# Patient Record
Sex: Male | Born: 1960 | Race: White | Hispanic: No | Smoking: Current every day smoker
Health system: Southern US, Community
[De-identification: ages and names within clinical notes are randomized; demographics above are authoritative.]

## PROBLEM LIST (undated history)

## (undated) DIAGNOSIS — M1990 Unspecified osteoarthritis, unspecified site: Secondary | ICD-10-CM

## (undated) DIAGNOSIS — F32A Depression, unspecified: Secondary | ICD-10-CM

## (undated) DIAGNOSIS — J45909 Unspecified asthma, uncomplicated: Secondary | ICD-10-CM

## (undated) DIAGNOSIS — F329 Major depressive disorder, single episode, unspecified: Secondary | ICD-10-CM

## (undated) DIAGNOSIS — I10 Essential (primary) hypertension: Secondary | ICD-10-CM

## (undated) DIAGNOSIS — E119 Type 2 diabetes mellitus without complications: Secondary | ICD-10-CM

## (undated) HISTORY — PX: KNEE SURGERY: SHX244

## (undated) HISTORY — PX: HAND SURGERY: SHX662

## (undated) HISTORY — PX: SHOULDER SURGERY: SHX246

## (undated) HISTORY — PX: BACK SURGERY: SHX140

---

## 2004-08-26 ENCOUNTER — Ambulatory Visit: Payer: Self-pay | Admitting: Family Medicine

## 2005-01-18 ENCOUNTER — Ambulatory Visit: Payer: Self-pay | Admitting: Pain Medicine

## 2005-04-12 ENCOUNTER — Ambulatory Visit: Payer: Self-pay | Admitting: Unknown Physician Specialty

## 2005-07-15 ENCOUNTER — Ambulatory Visit: Payer: Self-pay | Admitting: Unknown Physician Specialty

## 2005-10-29 ENCOUNTER — Other Ambulatory Visit: Payer: Self-pay

## 2005-11-05 ENCOUNTER — Ambulatory Visit: Payer: Self-pay | Admitting: Unknown Physician Specialty

## 2005-12-06 ENCOUNTER — Other Ambulatory Visit: Payer: Self-pay

## 2005-12-13 ENCOUNTER — Inpatient Hospital Stay: Payer: Self-pay | Admitting: Unknown Physician Specialty

## 2005-12-17 ENCOUNTER — Inpatient Hospital Stay: Payer: Self-pay | Admitting: Unknown Physician Specialty

## 2006-07-20 ENCOUNTER — Emergency Department: Payer: Self-pay | Admitting: Emergency Medicine

## 2006-11-24 ENCOUNTER — Ambulatory Visit: Payer: Self-pay | Admitting: Unknown Physician Specialty

## 2007-05-23 ENCOUNTER — Ambulatory Visit: Payer: Self-pay | Admitting: Internal Medicine

## 2008-10-20 ENCOUNTER — Ambulatory Visit: Payer: Self-pay | Admitting: Family Medicine

## 2009-03-22 ENCOUNTER — Ambulatory Visit: Payer: Self-pay | Admitting: Family Medicine

## 2010-05-02 ENCOUNTER — Ambulatory Visit: Payer: Self-pay | Admitting: Internal Medicine

## 2010-08-14 ENCOUNTER — Ambulatory Visit: Payer: Self-pay | Admitting: Unknown Physician Specialty

## 2010-08-26 ENCOUNTER — Encounter: Payer: Self-pay | Admitting: Unknown Physician Specialty

## 2010-10-26 ENCOUNTER — Ambulatory Visit: Payer: Self-pay | Admitting: Family Medicine

## 2010-11-13 ENCOUNTER — Ambulatory Visit: Payer: Self-pay | Admitting: Family Medicine

## 2011-05-03 ENCOUNTER — Ambulatory Visit: Payer: Self-pay | Admitting: Unknown Physician Specialty

## 2011-05-11 ENCOUNTER — Inpatient Hospital Stay: Payer: Self-pay | Admitting: Unknown Physician Specialty

## 2011-05-31 ENCOUNTER — Encounter: Payer: Self-pay | Admitting: Unknown Physician Specialty

## 2011-06-15 ENCOUNTER — Encounter: Payer: Self-pay | Admitting: Unknown Physician Specialty

## 2011-09-27 ENCOUNTER — Emergency Department: Payer: Self-pay | Admitting: Emergency Medicine

## 2011-09-27 LAB — COMPREHENSIVE METABOLIC PANEL
Albumin: 4.2 g/dL (ref 3.4–5.0)
Alkaline Phosphatase: 87 U/L (ref 50–136)
BUN: 12 mg/dL (ref 7–18)
Bilirubin,Total: 0.5 mg/dL (ref 0.2–1.0)
Calcium, Total: 10.4 mg/dL — ABNORMAL HIGH (ref 8.5–10.1)
Chloride: 104 mmol/L (ref 98–107)
Co2: 27 mmol/L (ref 21–32)
EGFR (African American): >60
Glucose: 105 mg/dL — ABNORMAL HIGH (ref 65–99)
SGOT(AST): 31 U/L (ref 15–37)
Sodium: 140 mmol/L (ref 136–145)
Total Protein: 8.4 g/dL — ABNORMAL HIGH (ref 6.4–8.2)

## 2011-09-27 LAB — URINALYSIS, COMPLETE
Bilirubin,UR: NEGATIVE
Blood: NEGATIVE
Glucose,UR: NEGATIVE mg/dL (ref 0–75)
Ketone: NEGATIVE
Leukocyte Esterase: NEGATIVE
Nitrite: NEGATIVE
Specific Gravity: 1.015 (ref 1.003–1.030)

## 2011-09-27 LAB — CBC
HCT: 45.2 % (ref 40.0–52.0)
HGB: 15.1 g/dL (ref 13.0–18.0)
MCHC: 33.4 g/dL (ref 32.0–36.0)
MCV: 89 fL (ref 80–100)
Platelet: 299 10*3/uL (ref 150–440)
RDW: 13.2 % (ref 11.5–14.5)
WBC: 13.5 10*3/uL — ABNORMAL HIGH (ref 3.8–10.6)

## 2011-09-27 LAB — LIPASE, BLOOD: Lipase: 118 U/L (ref 73–393)

## 2012-03-15 ENCOUNTER — Ambulatory Visit: Payer: Self-pay | Admitting: Orthopedic Surgery

## 2012-03-15 LAB — BASIC METABOLIC PANEL
Anion Gap: 9 (ref 7–16)
Calcium, Total: 9 mg/dL (ref 8.5–10.1)
Chloride: 106 mmol/L (ref 98–107)
Co2: 24 mmol/L (ref 21–32)
EGFR (African American): >60
Osmolality: 281 (ref 275–301)
Potassium: 3.7 mmol/L (ref 3.5–5.1)

## 2012-03-15 LAB — CBC
HCT: 43.9 % (ref 40.0–52.0)
HGB: 14.9 g/dL (ref 13.0–18.0)
MCH: 29.8 pg (ref 26.0–34.0)
MCV: 88 fL (ref 80–100)
Platelet: 247 10*3/uL (ref 150–440)
WBC: 11.2 10*3/uL — ABNORMAL HIGH (ref 3.8–10.6)

## 2012-03-15 LAB — MRSA PCR SCREENING

## 2012-03-15 LAB — SEDIMENTATION RATE: Erythrocyte Sed Rate: 4 mm/hr (ref 0–15)

## 2012-03-23 ENCOUNTER — Inpatient Hospital Stay: Payer: Self-pay | Admitting: Orthopedic Surgery

## 2012-03-24 LAB — BASIC METABOLIC PANEL
Anion Gap: 10 (ref 7–16)
Calcium, Total: 7.8 mg/dL — ABNORMAL LOW (ref 8.5–10.1)
Chloride: 106 mmol/L (ref 98–107)
Co2: 23 mmol/L (ref 21–32)
Creatinine: 0.84 mg/dL (ref 0.60–1.30)
EGFR (African American): >60
EGFR (Non-African Amer.): >60
Potassium: 3.4 mmol/L — ABNORMAL LOW (ref 3.5–5.1)
Sodium: 139 mmol/L (ref 136–145)

## 2012-03-24 LAB — PLATELET COUNT: Platelet: 196 10*3/uL (ref 150–440)

## 2012-03-24 LAB — HEMOGLOBIN: HGB: 13.3 g/dL (ref 13.0–18.0)

## 2012-04-18 ENCOUNTER — Encounter: Payer: Self-pay | Admitting: Orthopedic Surgery

## 2012-09-01 ENCOUNTER — Emergency Department: Payer: Self-pay | Admitting: Emergency Medicine

## 2012-09-01 LAB — CBC
HGB: 14.4 g/dL (ref 13.0–18.0)
MCHC: 34.2 g/dL (ref 32.0–36.0)
MCV: 90 fL (ref 80–100)
RDW: 13.4 % (ref 11.5–14.5)
WBC: 17.6 10*3/uL — ABNORMAL HIGH (ref 3.8–10.6)

## 2012-09-01 LAB — BASIC METABOLIC PANEL
Chloride: 99 mmol/L (ref 98–107)
EGFR (African American): >60
Glucose: 392 mg/dL — ABNORMAL HIGH (ref 65–99)
Osmolality: 284 (ref 275–301)
Potassium: 3.7 mmol/L (ref 3.5–5.1)

## 2012-09-12 ENCOUNTER — Ambulatory Visit: Payer: Self-pay | Admitting: Orthopedic Surgery

## 2012-09-21 ENCOUNTER — Ambulatory Visit: Payer: Self-pay | Admitting: Otolaryngology

## 2012-10-18 ENCOUNTER — Ambulatory Visit: Payer: Self-pay | Admitting: Family Medicine

## 2012-10-24 ENCOUNTER — Ambulatory Visit: Payer: Self-pay | Admitting: Otolaryngology

## 2012-11-29 ENCOUNTER — Ambulatory Visit: Payer: Self-pay | Admitting: Pain Medicine

## 2013-01-15 ENCOUNTER — Ambulatory Visit: Payer: Self-pay | Admitting: Pain Medicine

## 2013-03-21 ENCOUNTER — Ambulatory Visit: Payer: Self-pay | Admitting: Pain Medicine

## 2013-09-12 ENCOUNTER — Ambulatory Visit: Payer: Self-pay | Admitting: Family Medicine

## 2013-11-19 ENCOUNTER — Ambulatory Visit: Payer: Self-pay | Admitting: Physician Assistant

## 2013-12-16 ENCOUNTER — Ambulatory Visit: Payer: Self-pay | Admitting: Internal Medicine

## 2014-01-10 ENCOUNTER — Ambulatory Visit: Payer: Self-pay | Admitting: Unknown Physician Specialty

## 2014-03-20 DIAGNOSIS — E1159 Type 2 diabetes mellitus with other circulatory complications: Secondary | ICD-10-CM | POA: Insufficient documentation

## 2014-03-20 DIAGNOSIS — K219 Gastro-esophageal reflux disease without esophagitis: Secondary | ICD-10-CM | POA: Insufficient documentation

## 2014-03-20 DIAGNOSIS — E1165 Type 2 diabetes mellitus with hyperglycemia: Secondary | ICD-10-CM | POA: Insufficient documentation

## 2014-03-29 DIAGNOSIS — E785 Hyperlipidemia, unspecified: Secondary | ICD-10-CM | POA: Insufficient documentation

## 2014-03-29 DIAGNOSIS — J449 Chronic obstructive pulmonary disease, unspecified: Secondary | ICD-10-CM | POA: Insufficient documentation

## 2014-03-29 DIAGNOSIS — G8929 Other chronic pain: Secondary | ICD-10-CM | POA: Insufficient documentation

## 2014-08-27 DIAGNOSIS — G4733 Obstructive sleep apnea (adult) (pediatric): Secondary | ICD-10-CM | POA: Insufficient documentation

## 2014-08-27 DIAGNOSIS — G8929 Other chronic pain: Secondary | ICD-10-CM | POA: Insufficient documentation

## 2014-08-27 DIAGNOSIS — J302 Other seasonal allergic rhinitis: Secondary | ICD-10-CM | POA: Insufficient documentation

## 2014-10-01 NOTE — Discharge Summary (Signed)
PATIENT NAME:  Karl Weaver, Karl Weaver MR#:  [id number] DATE OF BIRTH:  October 05, 1960  DATE OF ADMISSION:  03/23/2012 DATE OF DISCHARGE:  03/25/2012  ADMITTING DIAGNOSIS: Unstable right total knee.   DISCHARGE DIAGNOSIS: Unstable right total knee.   PROCEDURE: Revision of total knee arthroplasty, right knee.   SURGEON: Leitha Schuller, M.D.   ASSISTANT: Devota Pace, NP, [place] Neighbor, PA-C.   HISTORY: Patient is a 54 year old male underwent right total knee replacement by Dr. Gerrit Heck in 2012. Over the last few months patient had noted some giving out sensation and instability within the right knee. The patient has an increased popping sensation. Patient saw Thompson Grayer, PA as well as Dr. Rosita Kea and was diagnosed with some instability of the knee and it was recommended to have revision for exchange of the polyethylene insert. Risks and benefits of the procedure have been discussed with the patient, and he as agreed and consented to the surgery. Surgery is scheduled for 03/23/2012.   PHYSICAL EXAMINATION: Head is normocephalic, atraumatic. Pupils equal, round, and reactive to light. LUNGS: Clear to auscultation. HEART: Regular rate and rhythm. RIGHT LOWER EXTREMITY: Examination of the right knee shows the patient has an old incision that is completely healed. There is no swelling, warmth or erythema. Patient has no effusion. He has good range of motion of 0 to 115 degrees. Patient does have significant mid flexion instability 90 degrees. He also has evidence of varus and valgus stress. The patient does have quite a bit of discomfort with the stress test. He is neurovascularly intact to the right lower extremity.   HOSPITAL COURSE: Patient was admitted to the hospital on 03/23/2012. He had surgery that day and was brought to the orthopedic floor from the PAC-U in stable condition. Patient's lab work and vital signs remained stable throughout his stay and he had progressed very well with physical therapy. On  03/25/2012 patient was stable and ready for discharge home with home health.   CONDITION AT DISCHARGE: Stable.   DISCHARGE INSTRUCTIONS:  1. Patient may gradually increase weight-bearing on the affected extremity. He may elevate the affected foot and leg on 1 or 2 pillows with the foot higher than the knee.  2. Knee-high TED hose on both legs and remove at bedtime. Replace when arising the next morning. 3. Diet: Patient may resume a regular diet as tolerated.  4. He should take aspirin 325 mg once a day. Resume typical home medications. Tylenol 650 to 1000 mg every six hours as needed for pain, oxycodone 5 to 10 mg every four hours as needed for pain.  5. Wound care: Apply an ice pack to affected area. Continue using Polar Care unit maintaining temperature between 40 and 50 degrees. Do not get the dressing or bandage wet or dirty. Call [facility] orthopedics if the dressing gets water under it. Leave the dressing on.  6. Symptoms to report: Call [facility] orthopedics if any of the following occur: Bright red bleeding from the incision wound, fever above 101.5 degrees, redness, swelling, or drainage at the incision. Call [facility] orthopedics office if you experience any increased leg pain, numbness, weakness in legs or bowel or bladder symptoms.   REFERRALS: Patient referred to home physical therapy. He should call [facility] orthopedics if he has not heard from them in 48 hours. He has a follow-up appointment at [facility] orthopedics in two weeks.   DISCHARGE MEDICATIONS:  1. ProAir HFA 90 mcg inhaler inhalation aerosol with adapter 2 puffs  inhaled once a day as needed. 2. Doxycycline hyclate 100 mg oral tablet 1 tablet orally 2 times a day.  ____________________________ Evon Slackhomas C. Ayub Kirsh, PA-C tcg:cms D: 03/27/2012 17:19:44 ET T: 03/28/2012 10:58:15 ET JOB#: [id number]  cc: Evon Slackhomas C. Lavra Imler, PA-C, <Dictator> Evon SlackHOMAS C Carlus Stay GeorgiaPA ELECTRONICALLY SIGNED 03/28/2012  12:38

## 2014-10-01 NOTE — Op Note (Signed)
PATIENT NAME:  Assunta FoundBURNS, Khaliq P MR#:  [id number] DATE OF BIRTH:  [birth year]  DATE OF PROCEDURE:  03/23/2012  PREOPERATIVE DIAGNOSIS: Unstable right total knee.   POSTOPERATIVE DIAGNOSIS: Unstable right total knee.  PROCEDURE: Revision of total knee arthroplasty, right knee.  SURGEON: Kennedy BuckerMichael Malyia Moro, MD  ASSISTANT: April Berndt, NP / Cranston Neighborhris Gaines, PA-C  DESCRIPTION OF PROCEDURE: The patient was brought to the Operating Room and, after adequate anesthesia was obtained, the right leg was prepped and draped in the usual sterile fashion with a tourniquet and Alvarado legholder utilized. The tourniquet was not used during the procedure. After patient identification and time out procedures were completed, the prior incision was opened with a midline skin incision followed by a medial parapatellar arthrotomy. On inspection of the knee, in flexion and extension, there was significant medial and lateral instability to direct visualization. Scar tissue was removed. 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 This was all removed followed by the prior polyethylene component. The prior component was a 13 mm and a 16 mm trial appeared to give a good fit so the 13 mm final component was inserted. In extension and flexion there was good stability with just slight opening in mid flexion of about a millimeter the valgus stress. The tibial component did appear to be somewhat internally rotated. After placement of the final component and determining that adequate stability had been obtained, the knee was thoroughly irrigated and hemostasis checked with electrocautery. The arthrotomy was repaired using a heavy quill suture followed by 2-0 quill subcutaneously followed by skin staples. Xeroform, 4 x 4's, Webril, and Ace wrap were applied. The patient was sent to the recovery room in stable  condition.   ESTIMATED BLOOD LOSS: 50.   COMPLICATIONS: None.    SPECIMEN: None.   IMPLANTS: Triathlon size 5, 16-mm PS tibial insert. ____________________________ Leitha SchullerMichael J. Jeanna Giuffre, MD mjm:slb D: 03/23/2012 23:14:02 ET T: 03/24/2012 10:22:23 ET JOB#: [id number]  cc: Leitha SchullerMichael J. Khaliyah Northrop, MD, <Dictator> Leitha SchullerMICHAEL J Darrel Gloss MD ELECTRONICALLY SIGNED 03/24/2012 15:17

## 2015-02-21 ENCOUNTER — Encounter: Payer: Self-pay | Admitting: *Deleted

## 2015-02-21 ENCOUNTER — Emergency Department: Payer: Medicaid Other

## 2015-02-21 DIAGNOSIS — E119 Type 2 diabetes mellitus without complications: Secondary | ICD-10-CM | POA: Insufficient documentation

## 2015-02-21 DIAGNOSIS — J45901 Unspecified asthma with (acute) exacerbation: Secondary | ICD-10-CM | POA: Diagnosis not present

## 2015-02-21 DIAGNOSIS — M25561 Pain in right knee: Secondary | ICD-10-CM | POA: Diagnosis present

## 2015-02-21 DIAGNOSIS — I10 Essential (primary) hypertension: Secondary | ICD-10-CM | POA: Insufficient documentation

## 2015-02-21 DIAGNOSIS — Z720 Tobacco use: Secondary | ICD-10-CM | POA: Diagnosis not present

## 2015-02-21 MED ORDER — ALBUTEROL SULFATE (2.5 MG/3ML) 0.083% IN NEBU
5.0000 mg | INHALATION_SOLUTION | Freq: Once | RESPIRATORY_TRACT | Status: AC
  Administered 2015-02-21: 5 mg via RESPIRATORY_TRACT
  Filled 2015-02-21: qty 6

## 2015-02-21 NOTE — ED Notes (Signed)
While in the lobby waiting, pt states he started coughing and then could not catch his breath. Brought back into the triage room for assessment, pt with increased respirations and mild wheezing. Will administer breathing treatment as protocol.

## 2015-02-21 NOTE — ED Notes (Signed)
Per ems, the pt felt his knee pop yesterday morning when he woke up, wants to be seen tonight for the pain. Pt with hx of knee surgery to this same extremity. Pt was able to ambulate to EMS stretcher per report.

## 2015-02-22 ENCOUNTER — Emergency Department
Admission: EM | Admit: 2015-02-22 | Discharge: 2015-02-22 | Disposition: A | Discharge status: Home / Self Care | Payer: Medicaid Other | Attending: Emergency Medicine | Admitting: Emergency Medicine

## 2015-02-22 DIAGNOSIS — M25561 Pain in right knee: Secondary | ICD-10-CM

## 2015-02-22 HISTORY — DX: Unspecified asthma, uncomplicated: J45.909

## 2015-02-22 HISTORY — DX: Essential (primary) hypertension: I10

## 2015-02-22 HISTORY — DX: Type 2 diabetes mellitus without complications: E11.9

## 2015-02-22 HISTORY — DX: Unspecified osteoarthritis, unspecified site: M19.90

## 2015-02-22 MED ORDER — OXYCODONE-ACETAMINOPHEN 5-325 MG PO TABS
2.0000 | ORAL_TABLET | Freq: Once | ORAL | Status: AC
  Administered 2015-02-22: 2 via ORAL
  Filled 2015-02-22: qty 2

## 2015-02-22 MED ORDER — OXYCODONE-ACETAMINOPHEN 5-325 MG PO TABS
1.0000 | ORAL_TABLET | ORAL | Status: DC | PRN
Start: 2015-02-22 — End: 2018-06-17

## 2015-02-22 NOTE — Discharge Instructions (Signed)
Your knee x-ray looked okay-no acute changes. Wear the knee immobilizer for comfort and support. Take Percocet if needed for acute pain. Return to the emergency department if you have increasing pain, redness or warmth, or if you have other urgent concerns.  Knee Pain The knee is the complex joint between your thigh and your lower leg. It is made up of bones, tendons, ligaments, and cartilage. The bones that make up the knee are:  The femur in the thigh.  The tibia and fibula in the lower leg.  The patella or kneecap riding in the groove on the lower femur. CAUSES  Knee pain is a common complaint with many causes. A few of these causes are:  Injury, such as:  A ruptured ligament or tendon injury.  Torn cartilage.  Medical conditions, such as:  Gout  Arthritis  Infections  Overuse, over training, or overdoing a physical activity. Knee pain can be minor or severe. Knee pain can accompany debilitating injury. Minor knee problems often respond well to self-care measures or get well on their own. More serious injuries may need medical intervention or even surgery. SYMPTOMS The knee is complex. Symptoms of knee problems can vary widely. Some of the problems are:  Pain with movement and weight bearing.  Swelling and tenderness.  Buckling of the knee.  Inability to straighten or extend your knee.  Your knee locks and you cannot straighten it.  Warmth and redness with pain and fever.  Deformity or dislocation of the kneecap. DIAGNOSIS  Determining what is wrong may be very straight forward such as when there is an injury. It can also be challenging because of the complexity of the knee. Tests to make a diagnosis may include:  Your caregiver taking a history and doing a physical exam.  Routine X-rays can be used to rule out other problems. X-rays will not reveal a cartilage tear. Some injuries of the knee can be diagnosed by:  Arthroscopy a surgical technique by which a  small video camera is inserted through tiny incisions on the sides of the knee. This procedure is used to examine and repair internal knee joint problems. Tiny instruments can be used during arthroscopy to repair the torn knee cartilage (meniscus).  Arthrography is a radiology technique. A contrast liquid is directly injected into the knee joint. Internal structures of the knee joint then become visible on X-ray film.  An MRI scan is a non X-ray radiology procedure in which magnetic fields and a computer produce two- or three-dimensional images of the inside of the knee. Cartilage tears are often visible using an MRI scanner. MRI scans have largely replaced arthrography in diagnosing cartilage tears of the knee.  Blood work.  Examination of the fluid that helps to lubricate the knee joint (synovial fluid). This is done by taking a sample out using a needle and a syringe. TREATMENT The treatment of knee problems depends on the cause. Some of these treatments are:  Depending on the injury, proper casting, splinting, surgery, or physical therapy care will be needed.  Give yourself adequate recovery time. Do not overuse your joints. If you begin to get sore during workout routines, back off. Slow down or do fewer repetitions.  For repetitive activities such as cycling or running, maintain your strength and nutrition.  Alternate muscle groups. For example, if you are a weight lifter, work the upper body on one day and the lower body the next.  Either tight or weak muscles do not give the proper  support for your knee. Tight or weak muscles do not absorb the stress placed on the knee joint. Keep the muscles surrounding the knee strong.  Take care of mechanical problems.  If you have flat feet, orthotics or special shoes may help. See your caregiver if you need help.  Arch supports, sometimes with wedges on the inner or outer aspect of the heel, can help. These can shift pressure away from the side  of the knee most bothered by osteoarthritis.  A brace called an "unloader" brace also may be used to help ease the pressure on the most arthritic side of the knee.  If your caregiver has prescribed crutches, braces, wraps or ice, use as directed. The acronym for this is PRICE. This means protection, rest, ice, compression, and elevation.  Nonsteroidal anti-inflammatory drugs (NSAIDs), can help relieve pain. But if taken immediately after an injury, they may actually increase swelling. Take NSAIDs with food in your stomach. Stop them if you develop stomach problems. Do not take these if you have a history of ulcers, stomach pain, or bleeding from the bowel. Do not take without your caregiver's approval if you have problems with fluid retention, heart failure, or kidney problems.  For ongoing knee problems, physical therapy may be helpful.  Glucosamine and chondroitin are over-the-counter dietary supplements. Both may help relieve the pain of osteoarthritis in the knee. These medicines are different from the usual anti-inflammatory drugs. Glucosamine may decrease the rate of cartilage destruction.  Injections of a corticosteroid drug into your knee joint may help reduce the symptoms of an arthritis flare-up. They may provide pain relief that lasts a few months. You may have to wait a few months between injections. The injections do have a small increased risk of infection, water retention, and elevated blood sugar levels.  Hyaluronic acid injected into damaged joints may ease pain and provide lubrication. These injections may work by reducing inflammation. A series of shots may give relief for as long as 6 months.  Topical painkillers. Applying certain ointments to your skin may help relieve the pain and stiffness of osteoarthritis. Ask your pharmacist for suggestions. Many over the-counter products are approved for temporary relief of arthritis pain.  In some countries, doctors often prescribe  topical NSAIDs for relief of chronic conditions such as arthritis and tendinitis. A review of treatment with NSAID creams found that they worked as well as oral medications but without the serious side effects. PREVENTION  Maintain a healthy weight. Extra pounds put more strain on your joints.  Get strong, stay limber. Weak muscles are a common cause of knee injuries. Stretching is important. Include flexibility exercises in your workouts.  Be smart about exercise. If you have osteoarthritis, chronic knee pain or recurring injuries, you may need to change the way you exercise. This does not mean you have to stop being active. If your knees ache after jogging or playing basketball, consider switching to swimming, water aerobics, or other low-impact activities, at least for a few days a week. Sometimes limiting high-impact activities will provide relief.  Make sure your shoes fit well. Choose footwear that is right for your sport.  Protect your knees. Use the proper gear for knee-sensitive activities. Use kneepads when playing volleyball or laying carpet. Buckle your seat belt every time you drive. Most shattered kneecaps occur in car accidents.  Rest when you are tired. SEEK MEDICAL CARE IF:  You have knee pain that is continual and does not seem to be getting better.  SEEK IMMEDIATE MEDICAL CARE IF:  Your knee joint feels hot to the touch and you have a high fever. MAKE SURE YOU:   Understand these instructions.  Will watch your condition.  Will get help right away if you are not doing well or get worse. Document Released: 03/28/2007 Document Revised: 08/23/2011 Document Reviewed: 03/28/2007 [facility] Patient Information 2015 [place], [place]. This information is not intended to replace advice given to you by your health care provider. Make sure you discuss any questions you have with your health care provider.

## 2015-02-22 NOTE — ED Provider Notes (Signed)
[facility] Emergency Department Provider Note  ____________________________________________  Time seen: 0145  I have reviewed the triage vital signs and the nursing notes.   HISTORY  Chief Complaint Knee Pain     HPI Karl Weaver. is a 54 y.o. male who had a knee replacement in the right knee in 2013. On Thursday of this past week, 2 days ago, he reports he bent down and felt a pop initially and has been having pain in the knee since.  With the significant pain, he has had decreased mobility. His been lying in a recliner. He now feels like he has discomfort that travels up and down the right leg. The pain is greatest on the lateral side of the right knee.  He denies any swelling of the calf. He denies any fever.  Upon arrival at the hospital, he began having increased shortness of breath. He reports that he has asthma and since he had this episode with his right knee 2 days ago he has had more difficulty breathing due to the pain.  He was treated with nebulizer treatment upfront by the nurses. He is breathing better now. He is having no chest pain.   Past Medical History  Diagnosis Date  . Diabetes mellitus without complication   . Asthma   . Arthritis   . Hypertension     There are no active problems to display for this patient.   Past Surgical History  Procedure Laterality Date  . Knee surgery      Current Outpatient Rx  Name  Route  Sig  Dispense  Refill  . oxyCODONE-acetaminophen (PERCOCET/ROXICET) 5-325 MG per tablet   Oral   Take 1 tablet by mouth every 4 (four) hours as needed for severe pain.   16 tablet   0     Allergies Codeine  No family history on file.  Social History Social History  Substance Use Topics  . Smoking status: Current Every Day Smoker  . Smokeless tobacco: None  . Alcohol Use: No    Review of Systems  Constitutional: Negative for fever. ENT: Negative for sore throat. Cardiovascular:  Negative for chest pain. Respiratory: Increased shortness of breath over the past 2 days. History of asthma. Gastrointestinal: Negative for abdominal pain, vomiting and diarrhea. Genitourinary: Negative for dysuria. Musculoskeletal: Pain, right knee Skin: Negative for rash. Neurological: Negative for headaches   10-point ROS otherwise negative.  ____________________________________________   PHYSICAL EXAM:  VITAL SIGNS: ED Triage Vitals  Enc Vitals Group     BP 02/21/15 2232 156/94 mmHg     Pulse Rate 02/21/15 2232 112     Resp 02/21/15 2232 18     Temp 02/21/15 2232 98.6 F (37 C)     Temp Source 02/21/15 2232 Oral     SpO2 02/21/15 2232 94 %     Weight 02/21/15 2232 255 lb (115.667 kg)     Height 02/21/15 2232 6' (1.829 m)     Head Cir --      Peak Flow --      Pain Score 02/21/15 2231 9     Pain Loc --      Pain Edu? --      Excl. in GC? --     Constitutional:  Alert and oriented. Well appearing and in no distress. ENT   Head: Normocephalic and atraumatic.   Nose: No congestion/rhinnorhea.   Mouth/Throat: Mucous membranes are moist. Cardiovascular: Normal rate, regular rhythm, no murmur noted Respiratory:  Normal respiratory effort, no tachypnea.    Breath sounds are clear and equal bilaterally.  Gastrointestinal: Soft and nontender. No distention.  Back: No muscle spasm, no tenderness, no CVA tenderness. Musculoskeletal: Midline incision over the right knee status post total knee replacement. This is well-healed with no erythema. The patient has notable pain on palpation as well as with any attempt at movement.  No noted edema. Neurologic:  Normal speech and language. No gross focal neurologic deficits are appreciated.  Skin:  Skin is warm, dry. No rash noted. Psychiatric: Mood and affect are normal. Speech and behavior are normal.  ____________________________________________    RADIOLOGY  Right knee FINDINGS: Previous right knee arthroplasty.  The hardware components are in anatomic alignment. No complications. No periprosthetic fracture or dislocation.  IMPRESSION: 1. No acute findings. ____________________________________________   INITIAL IMPRESSION / ASSESSMENT AND PLAN / ED COURSE  Pertinent labs & imaging results that were available during my care of the patient were reviewed by me and considered in my medical decision making (see chart for details).  X-ray of knee does not show any acute changes. We will place the patient in a knee immobilizer. We will treat his pain was 2 Percocets now and a prescription for ongoing Percocet to this weekend. We've asked him to follow-up with his orthopedic doctor, Dr. Kennedy Bucker.  The patient reports he has an appointment on Tuesday with his primary physician as well.  ____________________________________________   FINAL CLINICAL IMPRESSION(S) / ED DIAGNOSES  Final diagnoses:  Acute knee pain, right      Darien Ramus, MD 02/22/15 [phone]

## 2015-07-02 ENCOUNTER — Emergency Department
Admission: EM | Admit: 2015-07-02 | Discharge: 2015-07-02 | Disposition: A | Discharge status: Home / Self Care | Payer: Medicaid Other | Attending: Emergency Medicine | Admitting: Emergency Medicine

## 2015-07-02 DIAGNOSIS — Z7951 Long term (current) use of inhaled steroids: Secondary | ICD-10-CM | POA: Diagnosis not present

## 2015-07-02 DIAGNOSIS — Z7984 Long term (current) use of oral hypoglycemic drugs: Secondary | ICD-10-CM | POA: Insufficient documentation

## 2015-07-02 DIAGNOSIS — E1165 Type 2 diabetes mellitus with hyperglycemia: Secondary | ICD-10-CM | POA: Diagnosis not present

## 2015-07-02 DIAGNOSIS — F17200 Nicotine dependence, unspecified, uncomplicated: Secondary | ICD-10-CM | POA: Diagnosis not present

## 2015-07-02 DIAGNOSIS — Z79899 Other long term (current) drug therapy: Secondary | ICD-10-CM | POA: Diagnosis not present

## 2015-07-02 DIAGNOSIS — I10 Essential (primary) hypertension: Secondary | ICD-10-CM | POA: Insufficient documentation

## 2015-07-02 DIAGNOSIS — R079 Chest pain, unspecified: Secondary | ICD-10-CM | POA: Insufficient documentation

## 2015-07-02 DIAGNOSIS — R739 Hyperglycemia, unspecified: Secondary | ICD-10-CM

## 2015-07-02 LAB — COMPREHENSIVE METABOLIC PANEL
ALBUMIN: 4.3 g/dL (ref 3.5–5.0)
ALT: 79 U/L — ABNORMAL HIGH (ref 17–63)
ANION GAP: 11 (ref 5–15)
AST: 56 U/L — ABNORMAL HIGH (ref 15–41)
Alkaline Phosphatase: 111 U/L (ref 38–126)
BUN: 15 mg/dL (ref 6–20)
CHLORIDE: 104 mmol/L (ref 101–111)
CO2: 21 mmol/L — AB (ref 22–32)
Calcium: 9.5 mg/dL (ref 8.9–10.3)
Creatinine, Ser: 0.98 mg/dL (ref 0.61–1.24)
GFR calc Af Amer: >60 mL/min (ref >60)
GFR calc non Af Amer: >60 mL/min (ref >60)
GLUCOSE: 266 mg/dL — AB (ref 65–99)
POTASSIUM: 3.8 mmol/L (ref 3.5–5.1)
SODIUM: 136 mmol/L (ref 135–145)
Total Bilirubin: 0.6 mg/dL (ref 0.3–1.2)
Total Protein: 8.1 g/dL (ref 6.5–8.1)

## 2015-07-02 LAB — CBC
HCT: 44.2 % (ref 40.0–52.0)
Hemoglobin: 15.1 g/dL (ref 13.0–18.0)
MCH: 29.8 pg (ref 26.0–34.0)
MCHC: 34.2 g/dL (ref 32.0–36.0)
MCV: 87.3 fL (ref 80.0–100.0)
Platelets: 237 10*3/uL (ref 150–440)
RBC: 5.07 MIL/uL (ref 4.40–5.90)
RDW: 13 % (ref 11.5–14.5)
WBC: 11.3 10*3/uL — AB (ref 3.8–10.6)

## 2015-07-02 LAB — URINALYSIS COMPLETE WITH MICROSCOPIC (ARMC ONLY)
BACTERIA UA: NONE SEEN
BILIRUBIN URINE: NEGATIVE
Glucose, UA: >500 mg/dL — AB
HGB URINE DIPSTICK: NEGATIVE
LEUKOCYTES UA: NEGATIVE
Nitrite: NEGATIVE
PH: 5 (ref 5.0–8.0)
Protein, ur: 30 mg/dL — AB
SQUAMOUS EPITHELIAL / LPF: NONE SEEN
Specific Gravity, Urine: 1.029 (ref 1.005–1.030)

## 2015-07-02 LAB — GLUCOSE, CAPILLARY: Glucose-Capillary: 245 mg/dL — ABNORMAL HIGH (ref 65–99)

## 2015-07-02 LAB — TROPONIN I

## 2015-07-02 NOTE — ED Notes (Signed)
Pt presents to ED with c/o high blood sugar, pt states blood sugar reading have been varying x 2 weeks, ranging in there 170's. Pt states "I felt a little funny so I got up to check my sugar." Pt reports checked blood sugar at 2 am (CBG 479). Pt reports increase in thirst. Denies vision problems, denies chest pain, shortness of breath, or other complaints at this time. Pt reports he takes Metformin 1000g BID and Glimepiride 1 mg/day, reports took medication yesterday. Pt reports has appointment to see PCP at [facility] on 07/08/15. Pt also reports had left sided chest pain on Monday, states pain was sharp and stabbing. Denies chest pain at this time. Pt alert and oriented x 4, skin warm and dry, no increased work in breathing noted.

## 2015-07-02 NOTE — ED Provider Notes (Signed)
[facility] Emergency Department Provider Note  ____________________________________________  Time seen: Approximately 350 AM  I have reviewed the triage vital signs and the nursing notes.   HISTORY  Chief Complaint Hyperglycemia    HPI Karl Volker. is a 55 y.o. male who comes into the hospital today with elevated blood sugars.The patient reports that his blood sugars have been out of whack for the past 2 weeks. He reports that they have been ranging from 178-378 when they're normally in the high 90s. He reports that tonight when he checked his blood sugar was 479 and has not been that high recently. The patient called EMS to come in for evaluation. The patient reports that he has been drinking water and coffee and he has been eating mainly meat and vegetables as well as some salad. The patient reports that he has been taking his medicine like he supposed to. He took his metformin today with the second dose being around 6 PM and that he had some teriyaki chicken wings at about 9:30. The patient reports that he has called his doctor and has an appointment next week but his blood sugars are still very high. The patient also has some problems with his arthritis and he is unsure exactly what to do so he came into the hospital tonight. The patient reports that he did not take any other medicines after calling EMS and checking his blood sugar.   Past Medical History  Diagnosis Date  . Diabetes mellitus without complication   . Asthma   . Arthritis   . Hypertension     There are no active problems to display for this patient.   Past Surgical History  Procedure Laterality Date  . Knee surgery      Current Outpatient Rx  Name  Route  Sig  Dispense  Refill  . beclomethasone (QVAR) 40 MCG/ACT inhaler   Inhalation   Inhale 2 puffs into the lungs 2 (two) times daily.         Marland Kitchen glimepiride (AMARYL) 1 MG tablet   Oral   Take 1 mg by mouth daily with  breakfast.         . metFORMIN (GLUCOPHAGE) 1000 MG tablet   Oral   Take 1,000 mg by mouth 2 (two) times daily with a meal.         . pantoprazole (PROTONIX) 40 MG tablet   Oral   Take 40 mg by mouth daily.         Marland Kitchen oxyCODONE-acetaminophen (PERCOCET/ROXICET) 5-325 MG per tablet   Oral   Take 1 tablet by mouth every 4 (four) hours as needed for severe pain. Patient not taking: Reported on 07/02/2015   16 tablet   0     Allergies Codeine  No family history on file.  Social History Social History  Substance Use Topics  . Smoking status: Current Every Day Smoker  . Smokeless tobacco: Not on file  . Alcohol Use: No    Review of Systems Constitutional: No fever/chills Eyes: No visual changes. ENT: No sore throat. Cardiovascular:  chest pain. Respiratory: Denies shortness of breath. Gastrointestinal: No abdominal pain.  No nausea, no vomiting.  No diarrhea.  No constipation. Genitourinary: Negative for dysuria. Musculoskeletal: Negative for back pain. Skin: Negative for rash. Neurological: Negative for headaches, focal weakness or numbness. 10-point ROS otherwise negative.  ____________________________________________   PHYSICAL EXAM:  VITAL SIGNS: ED Triage Vitals  Enc Vitals Group     BP 07/02/15  0252 171/97 mmHg     Pulse Rate 07/02/15 0252 102     Resp 07/02/15 0252 18     Temp 07/02/15 0252 98.1 F (36.7 C)     Temp Source 07/02/15 0252 Oral     SpO2 07/02/15 0252 98 %     Weight 07/02/15 0252 252 lb (114.306 kg)     Height 07/02/15 0252 6' (1.829 m)     Head Cir --      Peak Flow --      Pain Score 07/02/15 0253 7     Pain Loc --      Pain Edu? --      Excl. in GC? --     Constitutional: Alert and oriented. Well appearing and in no acute distress. Eyes: Conjunctivae are normal. PERRL. EOMI. Head: Atraumatic. Nose: No congestion/rhinnorhea. Mouth/Throat: Mucous membranes are moist.  Oropharynx non-erythematous. Cardiovascular: Normal  rate, regular rhythm. Grossly normal heart sounds.  Good peripheral circulation. Respiratory: Normal respiratory effort.  No retractions. Lungs CTAB. Gastrointestinal: Soft and nontender. No distention. Positive bowel sounds Musculoskeletal: No lower extremity tenderness nor edema.   Neurologic:  Normal speech and language.  Skin:  Skin is warm, dry and intact.  Psychiatric: Mood and affect are normal.   ____________________________________________   LABS (all labs ordered are listed, but only abnormal results are displayed)  Labs Reviewed  CBC - Abnormal; Notable for the following:    WBC 11.3 (*)    All other components within normal limits  COMPREHENSIVE METABOLIC PANEL - Abnormal; Notable for the following:    CO2 21 (*)    Glucose, Bld 266 (*)    AST 56 (*)    ALT 79 (*)    All other components within normal limits  URINALYSIS COMPLETEWITH MICROSCOPIC ([facility] ONLY) - Abnormal; Notable for the following:    Color, Urine YELLOW (*)    APPearance CLEAR (*)    Glucose, UA >500 (*)    Ketones, ur TRACE (*)    Protein, ur 30 (*)    All other components within normal limits  GLUCOSE, CAPILLARY - Abnormal; Notable for the following:    Glucose-Capillary 245 (*)    All other components within normal limits  TROPONIN I  CBG MONITORING, ED   ____________________________________________  EKG  ED ECG REPORT I, Rebecka Apley, the attending physician, personally viewed and interpreted this ECG.   Date: 07/02/2015  EKG Time: 416  Rate: 93  Rhythm: normal sinus rhythm  Axis: normal  Intervals:none  ST&T Change: st depression II, V3, V4, V5, V6  ____________________________________________  RADIOLOGY  none ____________________________________________   PROCEDURES  Procedure(s) performed: None  Critical Care performed: No  ____________________________________________   INITIAL IMPRESSION / ASSESSMENT AND PLAN / ED COURSE  Pertinent labs & imaging results  that were available during my care of the patient were reviewed by me and considered in my medical decision making (see chart for details).  This is a 55 year old male who comes into the hospital today with elevated blood sugars. The patient's blood sugar here in the emergency department was 245 on fingerstick as well as 266 on the comprehensive metabolic panel. I will check a troponin as the patient did report to the nurse that he had some chest pain earlier in the week and I will discharge the patient to follow-up with his primary care physician if everything remains unremarkable. The patient is drinking water while in the emergency department.  The patient's blood sugar is  in the 200s he is drinking water in the ED. He had some chest pain earlier in the week but it is improved currently. He will be discharged home to follow up with his primary care physician.  ____________________________________________   FINAL CLINICAL IMPRESSION(S) / ED DIAGNOSES  Final diagnoses:  Hyperglycemia      Rebecka Apley, MD 07/02/15 [phone]

## 2015-07-02 NOTE — ED Notes (Signed)
Pt in by ems for fsbs of 479 ems states en route he was 250 pt wants to know why numbers vary.

## 2015-07-02 NOTE — Discharge Instructions (Signed)
Hyperglycemia °Hyperglycemia occurs when the glucose (sugar) in your blood is too high. Hyperglycemia can happen for many reasons, but it most often happens to people who do not know they have diabetes or are not managing their diabetes properly.  °CAUSES  °Whether you have diabetes or not, there are other causes of hyperglycemia. Hyperglycemia can occur when you have diabetes, but it can also occur in other situations that you might not be as aware of, such as: °Diabetes °· If you have diabetes and are having problems controlling your blood glucose, hyperglycemia could occur because of some of the following reasons: °¨ Not following your meal plan. °¨ Not taking your diabetes medications or not taking it properly. °¨ Exercising less or doing less activity than you normally do. °¨ Being sick. °Pre-diabetes °· This cannot be ignored. Before people develop Type 2 diabetes, they almost always have "pre-diabetes." This is when your blood glucose levels are higher than normal, but not yet high enough to be diagnosed as diabetes. Research has shown that some long-term damage to the body, especially the heart and circulatory system, may already be occurring during pre-diabetes. If you take action to manage your blood glucose when you have pre-diabetes, you may delay or prevent Type 2 diabetes from developing. °Stress °· If you have diabetes, you may be "diet" controlled or on oral medications or insulin to control your diabetes. However, you may find that your blood glucose is higher than usual in the hospital whether you have diabetes or not. This is often referred to as "stress hyperglycemia." Stress can elevate your blood glucose. This happens because of hormones put out by the body during times of stress. If stress has been the cause of your high blood glucose, it can be followed regularly by your caregiver. That way he/she can make sure your hyperglycemia does not continue to get worse or progress to  diabetes. °Steroids °· Steroids are medications that act on the infection fighting system (immune system) to block inflammation or infection. One side effect can be a rise in blood glucose. Most people can produce enough extra insulin to allow for this rise, but for those who cannot, steroids make blood glucose levels go even higher. It is not unusual for steroid treatments to "uncover" diabetes that is developing. It is not always possible to determine if the hyperglycemia will go away after the steroids are stopped. A special blood test called an A1c is sometimes done to determine if your blood glucose was elevated before the steroids were started. °SYMPTOMS °· Thirsty. °· Frequent urination. °· Dry mouth. °· Blurred vision. °· Tired or fatigue. °· Weakness. °· Sleepy. °· Tingling in feet or leg. °DIAGNOSIS  °Diagnosis is made by monitoring blood glucose in one or all of the following ways: °· A1c test. This is a chemical found in your blood. °· Fingerstick blood glucose monitoring. °· Laboratory results. °TREATMENT  °First, knowing the cause of the hyperglycemia is important before the hyperglycemia can be treated. Treatment may include, but is not be limited to: °· Education. °· Change or adjustment in medications. °· Change or adjustment in meal plan. °· Treatment for an illness, infection, etc. °· More frequent blood glucose monitoring. °· Change in exercise plan. °· Decreasing or stopping steroids. °· Lifestyle changes. °HOME CARE INSTRUCTIONS  °· Test your blood glucose as directed. °· Exercise regularly. Your caregiver will give you instructions about exercise. Pre-diabetes or diabetes which comes on with stress is helped by exercising. °· Eat wholesome,   balanced meals. Eat often and at regular, fixed times. Your caregiver or nutritionist will give you a meal plan to guide your sugar intake. °· Being at an ideal weight is important. If needed, losing as little as 10 to 15 pounds may help improve blood  glucose levels. °SEEK MEDICAL CARE IF:  °· You have questions about medicine, activity, or diet. °· You continue to have symptoms (problems such as increased thirst, urination, or weight gain). °SEEK IMMEDIATE MEDICAL CARE IF:  °· You are vomiting or have diarrhea. °· Your breath smells fruity. °· You are breathing faster or slower. °· You are very sleepy or incoherent. °· You have numbness, tingling, or pain in your feet or hands. °· You have chest pain. °· Your symptoms get worse even though you have been following your caregiver's orders. °· If you have any other questions or concerns. °  °This information is not intended to replace advice given to you by your health care provider. Make sure you discuss any questions you have with your health care provider. °  °Document Released: 11/24/2000 Document Revised: 08/23/2011 Document Reviewed: 02/04/2015 °Elsevier Interactive Patient Education ©2016 [employer] ° °

## 2016-05-17 ENCOUNTER — Emergency Department: Payer: Medicaid Other

## 2016-05-17 ENCOUNTER — Encounter: Payer: Self-pay | Admitting: Emergency Medicine

## 2016-05-17 ENCOUNTER — Emergency Department
Admission: EM | Admit: 2016-05-17 | Discharge: 2016-05-17 | Disposition: A | Discharge status: Home / Self Care | Payer: Medicaid Other | Attending: Emergency Medicine | Admitting: Emergency Medicine

## 2016-05-17 DIAGNOSIS — R05 Cough: Secondary | ICD-10-CM | POA: Diagnosis present

## 2016-05-17 DIAGNOSIS — Z79899 Other long term (current) drug therapy: Secondary | ICD-10-CM | POA: Diagnosis not present

## 2016-05-17 DIAGNOSIS — Z7984 Long term (current) use of oral hypoglycemic drugs: Secondary | ICD-10-CM | POA: Insufficient documentation

## 2016-05-17 DIAGNOSIS — F17200 Nicotine dependence, unspecified, uncomplicated: Secondary | ICD-10-CM | POA: Diagnosis not present

## 2016-05-17 DIAGNOSIS — I10 Essential (primary) hypertension: Secondary | ICD-10-CM | POA: Insufficient documentation

## 2016-05-17 DIAGNOSIS — E119 Type 2 diabetes mellitus without complications: Secondary | ICD-10-CM | POA: Insufficient documentation

## 2016-05-17 DIAGNOSIS — J40 Bronchitis, not specified as acute or chronic: Secondary | ICD-10-CM | POA: Diagnosis not present

## 2016-05-17 MED ORDER — PSEUDOEPH-BROMPHEN-DM 30-2-10 MG/5ML PO SYRP: 5.0000 mL | ORAL_SOLUTION | Freq: Four times a day (QID) | ORAL | 0 refills | Status: DC | PRN

## 2016-05-17 MED ORDER — SULFAMETHOXAZOLE-TRIMETHOPRIM 800-160 MG PO TABS: 1.0000 | ORAL_TABLET | Freq: Two times a day (BID) | ORAL | 0 refills | Status: DC

## 2016-05-17 NOTE — ED Provider Notes (Signed)
[facility] Emergency Department Provider Note   ____________________________________________   First MD Initiated Contact with Patient 05/17/16 [phone]     (approximate)  I have reviewed the triage vital signs and the nursing notes.   HISTORY  Chief Complaint Cough    HPI Karl MediaGeorge Patrick Nedved Jr. is a 55 y.o. male patient complaining of productive cough with chest congestion for 4 days. Patient stated this is seasonal episode for him. Patient also complaining of chest pain secondary to the cough. Patient denies any fever associated this complaint. Patient denies any shortness of breath. Patient rates his pain as a 5/10. Patient described a pain as "achy". No palliative measures to his complaint. Patient's daughter was diagnosed and treated for bronchitis 4 days ago.   Past Medical History:  Diagnosis Date  . Arthritis   . Asthma   . Diabetes mellitus without complication (HCC)   . Hypertension     There are no active problems to display for this patient.   Past Surgical History:  Procedure Laterality Date  . KNEE SURGERY      Prior to Admission medications   Medication Sig Start Date End Date Taking? Authorizing Provider  beclomethasone (QVAR) 40 MCG/ACT inhaler Inhale 2 puffs into the lungs 2 (two) times daily.    Historical Provider, MD  brompheniramine-pseudoephedrine-DM 30-2-10 MG/5ML syrup Take 5 mLs by mouth 4 (four) times daily as needed. 05/17/16   Joni Reiningonald K Yanni Quiroa, PA-C  glimepiride (AMARYL) 1 MG tablet Take 1 mg by mouth daily with breakfast.    Historical Provider, MD  metFORMIN (GLUCOPHAGE) 1000 MG tablet Take 1,000 mg by mouth 2 (two) times daily with a meal.    Historical Provider, MD  oxyCODONE-acetaminophen (PERCOCET/ROXICET) 5-325 MG per tablet Take 1 tablet by mouth every 4 (four) hours as needed for severe pain. Patient not taking: Reported on 07/02/2015 02/22/15   Darien Ramusavid W Kaminski, MD  pantoprazole (PROTONIX) 40 MG tablet Take 40  mg by mouth daily.    Historical Provider, MD  sulfamethoxazole-trimethoprim (BACTRIM DS,SEPTRA DS) 800-160 MG tablet Take 1 tablet by mouth 2 (two) times daily. 05/17/16   Joni Reiningonald K Tika Hannis, PA-C    Allergies Codeine  History reviewed. No pertinent family history.  Social History Social History  Substance Use Topics  . Smoking status: Current Every Day Smoker  . Smokeless tobacco: Never Used  . Alcohol use No    Review of Systems Constitutional: No fever/chills Eyes: No visual changes. ENT: No sore throat. Cardiovascular: Denies chest pain. Respiratory: Denies shortness of breath. He*aspirin. Gastrointestinal: No abdominal pain.  No nausea, no vomiting.  No diarrhea.  No constipation. Genitourinary: Negative for dysuria. Musculoskeletal: Back pain secondary to arthritis.  Skin: Negative for rash. Neurological: Negative for headaches, focal weakness or numbness. Endocrine:Diabetes and hypertension. Allergic/Immunilogical: Codeine ______________________________________   PHYSICAL EXAM:  VITAL SIGNS: ED Triage Vitals  Enc Vitals Group     BP 05/17/16 0857 138/90     Pulse Rate 05/17/16 0857 89     Resp 05/17/16 0857 18     Temp 05/17/16 0857 97.5 F (36.4 C)     Temp Source 05/17/16 0857 Oral     SpO2 05/17/16 0857 98 %     Weight 05/17/16 0857 234 lb (106.1 kg)     Height 05/17/16 0857 6' (1.829 m)     Head Circumference --      Peak Flow --      Pain Score 05/17/16 0901 5  Pain Loc --      Pain Edu? --      Excl. in GC? --     Constitutional: Alert and oriented. Well appearing and in no acute distress. Eyes: Conjunctivae are normal. PERRL. EOMI. Head: Atraumatic. Nose: No congestion/rhinnorhea. Mouth/Throat: Mucous membranes are moist.  Oropharynx non-erythematous. Neck: No stridor. No cervical spine tenderness to palpation. Hematological/Lymphatic/Immunilogical: No cervical lymphadenopathy. Cardiovascular: Normal rate, regular rhythm. Grossly normal heart  sounds.  Good peripheral circulation. Respiratory: Normal respiratory effort.  No retractions. Lungs mild rales productive cough. Gastrointestinal: Soft and nontender. No distention. No abdominal bruits. No CVA tenderness. Musculoskeletal: No lower extremity tenderness nor edema.  No joint effusions. Neurologic:  Normal speech and language. No gross focal neurologic deficits are appreciated. No gait instability. Skin:  Skin is warm, dry and intact. No rash noted. Psychiatric: Mood and affect are normal. Speech and behavior are normal.  ____________________________________________   LABS (all labs ordered are listed, but only abnormal results are displayed)  Labs Reviewed - No data to display ____________________________________________  EKG  EKG read by heart station doctor. ____________________________________________  RADIOLOGY  No acute findings on chest x-ray. ____________________________________________   PROCEDURES  Procedure(s) performed: None  Procedures  Critical Care performed: No  ____________________________________________   INITIAL IMPRESSION / ASSESSMENT AND PLAN / ED COURSE  Pertinent labs & imaging results that were available during my care of the patient were reviewed by me and considered in my medical decision making (see chart for details).  Bronchitis. Patient given discharge care instructions. Patient given prescription for Bactrim DS and Bromfed-DM. Patient advised to follow-up with family doctor condition persists.  Clinical Course      ____________________________________________   FINAL CLINICAL IMPRESSION(S) / ED DIAGNOSES  Final diagnoses:  Bronchitis      NEW MEDICATIONS STARTED DURING THIS VISIT:  New Prescriptions   BROMPHENIRAMINE-PSEUDOEPHEDRINE-DM 30-2-10 MG/5ML SYRUP    Take 5 mLs by mouth 4 (four) times daily as needed.   SULFAMETHOXAZOLE-TRIMETHOPRIM (BACTRIM DS,SEPTRA DS) 800-160 MG TABLET    Take 1 tablet by mouth  2 (two) times daily.     Note:  This document was prepared using Dragon voice recognition software and may include unintentional dictation errors.    Joni Reiningonald K Clotine Heiner, PA-C 05/17/16 1002    Rockne MenghiniAnne-Caroline Norman, MD 05/17/16 [phone]

## 2016-05-17 NOTE — ED Notes (Signed)
Pt states, "I have bronchitis" pt reports he gets this way every year around this time and that is what it always is. Pt c/o productive cough, itchy throat, nasal congestion and pain to chest when he coughs. Pt denies fevers.

## 2016-05-17 NOTE — ED Triage Notes (Signed)
Pt to ed with c/o cough, congestion x 4 days.  Denies fever.

## 2016-08-13 IMAGING — CR DG KNEE COMPLETE 4+V*R*
1 series · 4 of 4 positions shown · non-contrast
Comparison: 05/11/2011

CLINICAL DATA: Knee pain.

EXAM:
RIGHT KNEE - COMPLETE 4+ VIEW

[Series 1: t knee ap right · 0.14mm/px · 4 of 4 slices shown]
[im 1/4]
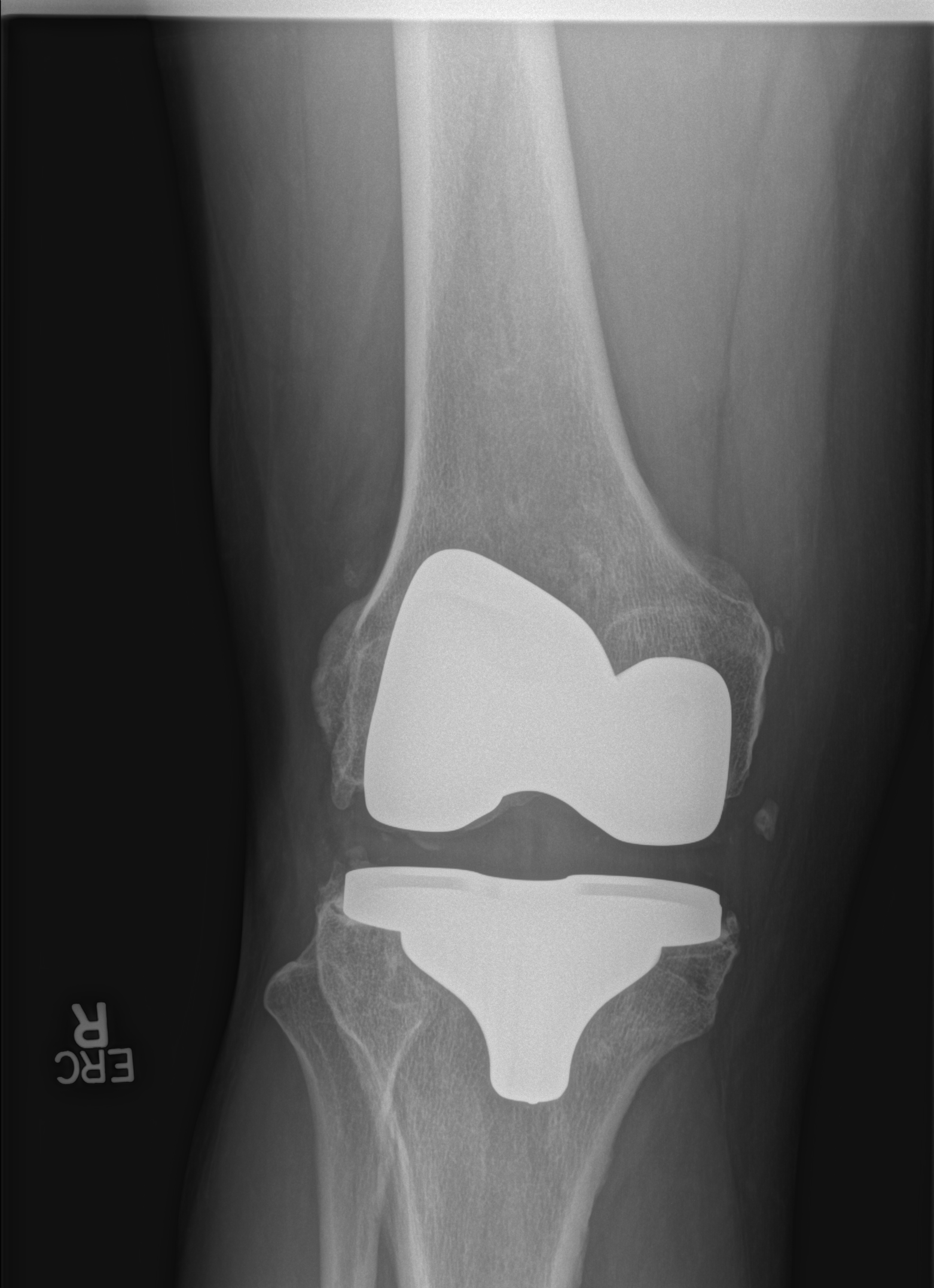
[im 2/4]
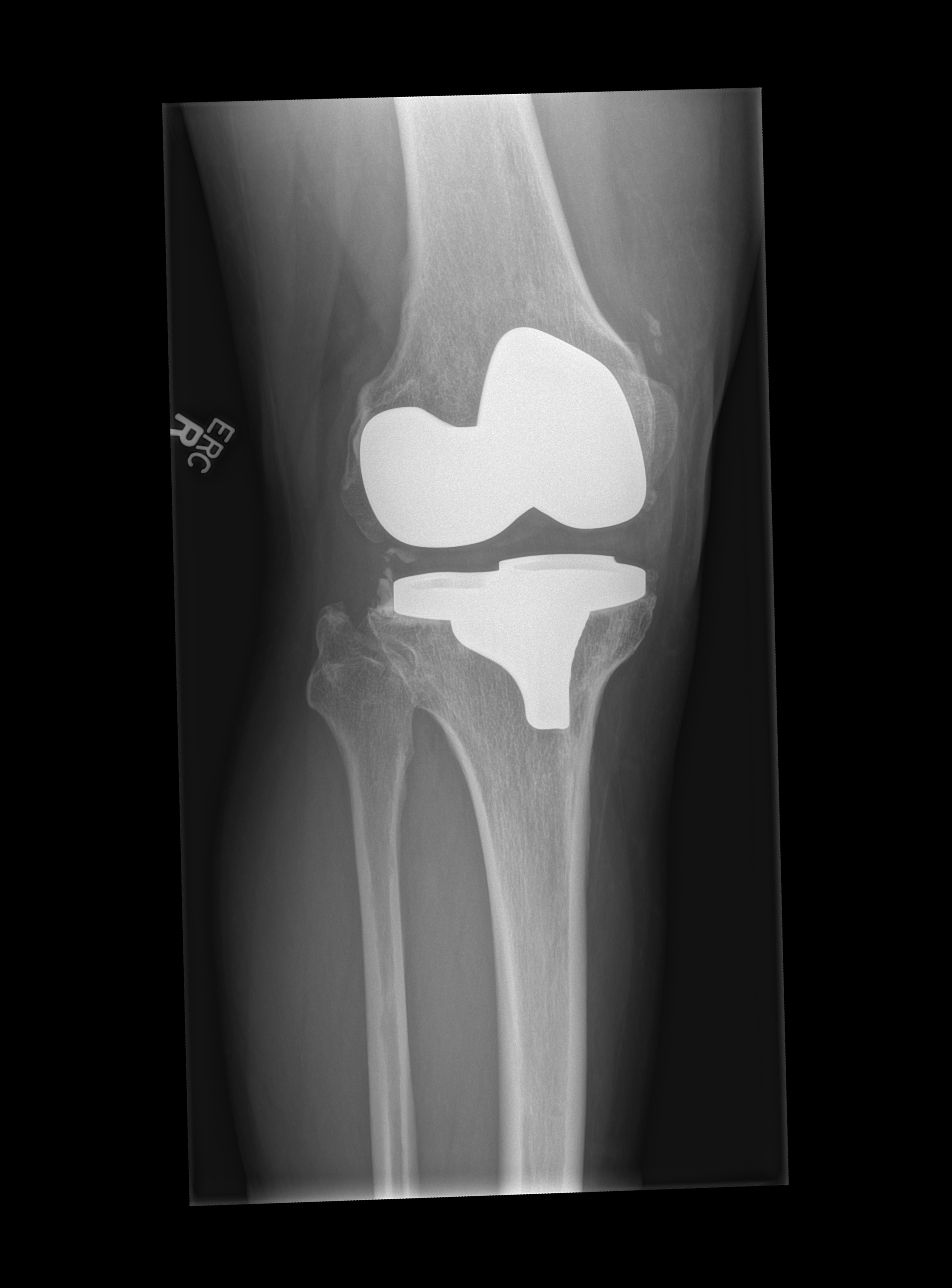
[im 3/4]
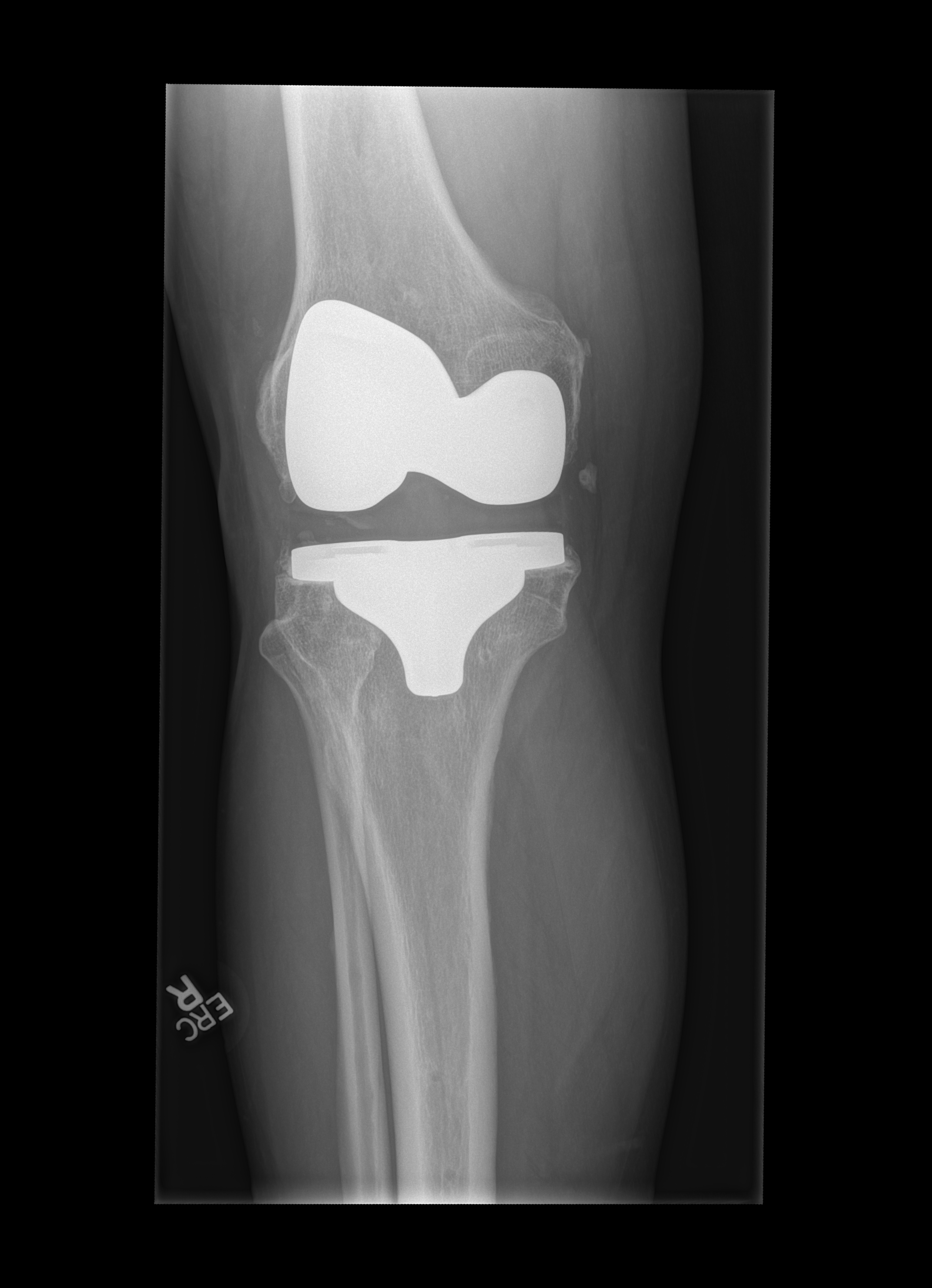
[im 4/4]
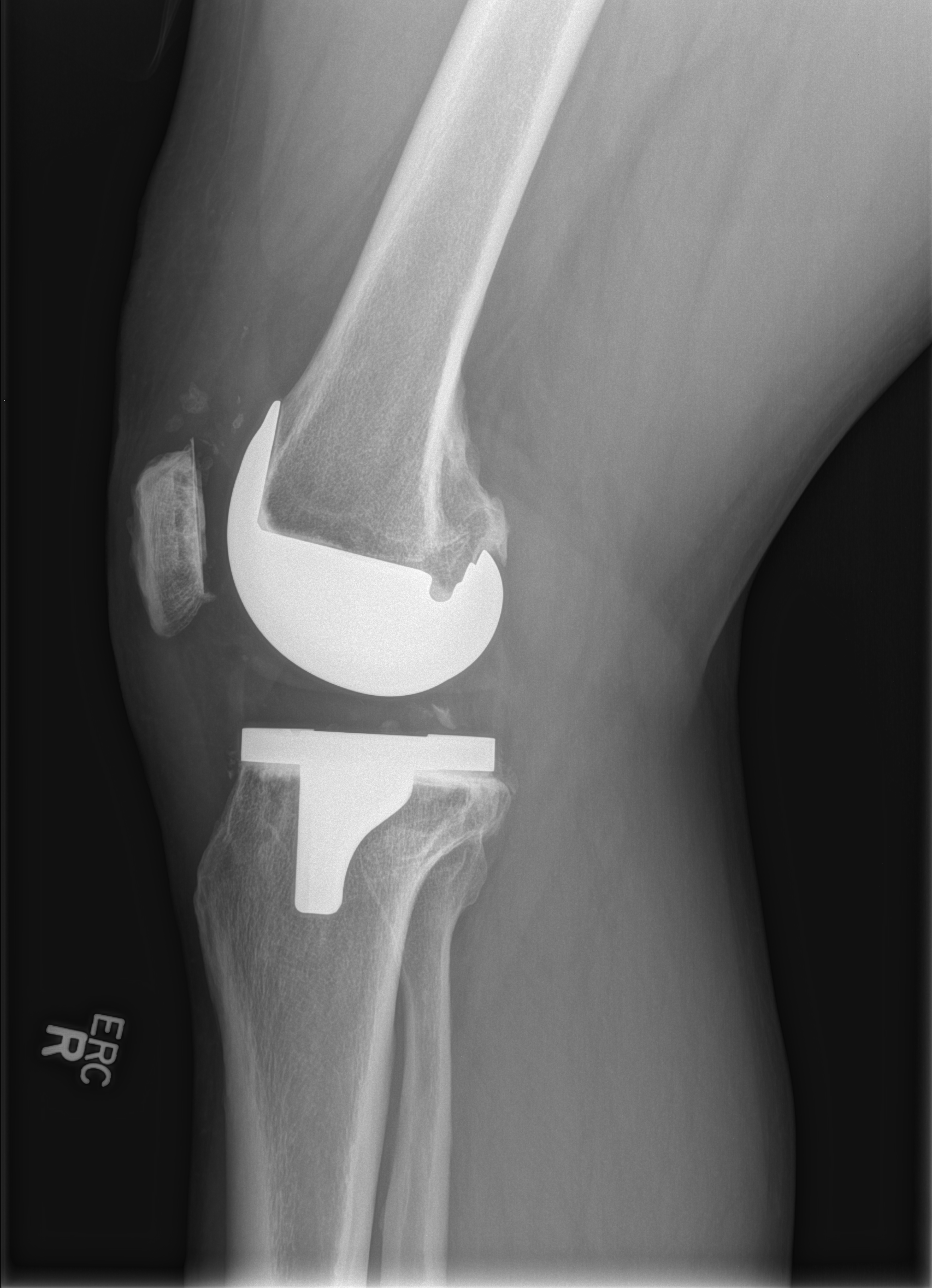

[4 of 4 positions shown; findings below may reference images not displayed]

FINDINGS: Previous right knee arthroplasty. The hardware components are in
anatomic alignment. No complications. No periprosthetic fracture or
dislocation.
IMPRESSION: 1. No acute findings.

## 2017-02-05 ENCOUNTER — Ambulatory Visit
Admission: EM | Admit: 2017-02-05 | Discharge: 2017-02-05 | Disposition: A | Discharge status: Home / Self Care | Payer: Medicaid Other | Attending: Family Medicine | Admitting: Family Medicine

## 2017-02-05 DIAGNOSIS — S29012A Strain of muscle and tendon of back wall of thorax, initial encounter: Secondary | ICD-10-CM | POA: Diagnosis not present

## 2017-02-05 DIAGNOSIS — I10 Essential (primary) hypertension: Secondary | ICD-10-CM

## 2017-02-05 HISTORY — DX: Depression, unspecified: F32.A

## 2017-02-05 HISTORY — DX: Unspecified osteoarthritis, unspecified site: M19.90

## 2017-02-05 HISTORY — DX: Major depressive disorder, single episode, unspecified: F32.9

## 2017-02-05 MED ORDER — CYCLOBENZAPRINE HCL 10 MG PO TABS: 10.0000 mg | ORAL_TABLET | Freq: Every day | ORAL | 0 refills | Status: DC

## 2017-02-05 MED ORDER — LISINOPRIL 10 MG PO TABS: 10.0000 mg | ORAL_TABLET | Freq: Every day | ORAL | 0 refills | Status: DC

## 2017-02-05 NOTE — ED Triage Notes (Addendum)
Pt started on Cymbalta yesterday and went to have his blood pressure checked today at [facility]. He found it to be elevated 163/94 and then went home and rechecked it and it was 198/108. 3-4 days of left shoulder pain, left elbow pain, and  left hand fingers going numb. Pt is a one-pack per day smoker.

## 2017-02-05 NOTE — ED Provider Notes (Addendum)
[facility]    CSN: [id number] Arrival date & time: 02/05/17  1418     History   Chief Complaint Chief Complaint  Patient presents with  . Hypertension    HPI Karl Weaver. is a 56 y.o. male.   56 yo male with a c/o "high blood pressure" after checking it today. Also complains of 3-4 days of left shoulder and left upper back pain radiating intermittently down the arm. Denies any chest pains or shortness of breath. Patient is diabetic and states he used to take lisinopril but that it was discontinued because his blood pressure was normal.     Hypertension     Past Medical History:  Diagnosis Date  . Arthritis   . Asthma   . Degenerative arthritis   . Depression   . Diabetes mellitus without complication (HCC)   . Hypertension     There are no active problems to display for this patient.   Past Surgical History:  Procedure Laterality Date  . BACK SURGERY    . HAND SURGERY    . KNEE SURGERY    . SHOULDER SURGERY         Home Medications    Prior to Admission medications   Medication Sig Start Date End Date Taking? Authorizing Provider  DULoxetine (CYMBALTA) 30 MG capsule Take 30 mg by mouth 2 (two) times daily.   Yes [provider]  beclomethasone (QVAR) 40 MCG/ACT inhaler Inhale 2 puffs into the lungs 2 (two) times daily.    [provider]  brompheniramine-pseudoephedrine-DM 30-2-10 MG/5ML syrup Take 5 mLs by mouth 4 (four) times daily as needed. 05/17/16   Joni Reining, PA-C  cyclobenzaprine (FLEXERIL) 10 MG tablet Take 1 tablet (10 mg total) by mouth at bedtime. 02/05/17   Payton Mccallum, MD  glimepiride (AMARYL) 1 MG tablet Take 1 mg by mouth daily with breakfast.    [provider]  lisinopril (PRINIVIL,ZESTRIL) 10 MG tablet Take 1 tablet (10 mg total) by mouth daily. 02/05/17   Payton Mccallum, MD  metFORMIN (GLUCOPHAGE) 1000 MG tablet Take 1,000 mg by mouth 2 (two) times daily with a meal.    [provider]  oxyCODONE-acetaminophen (PERCOCET/ROXICET) 5-325 MG per tablet Take 1 tablet by mouth every 4 (four) hours as needed for severe pain. Patient not taking: Reported on 07/02/2015 02/22/15   Darien Ramus, MD  pantoprazole (PROTONIX) 40 MG tablet Take 40 mg by mouth daily.    [provider]  sulfamethoxazole-trimethoprim (BACTRIM DS,SEPTRA DS) 800-160 MG tablet Take 1 tablet by mouth 2 (two) times daily. 05/17/16   Joni Reining, PA-C    Family History History reviewed. No pertinent family history.  Social History Social History  Substance Use Topics  . Smoking status: Current Every Day Smoker    Packs/day: 1.00    Types: Cigarettes  . Smokeless tobacco: Never Used  . Alcohol use No     Allergies   Codeine   Review of Systems Review of Systems   Physical Exam Triage Vital Signs ED Triage Vitals  Enc Vitals Group     BP 02/05/17 1427 (!) 158/88     Pulse Rate 02/05/17 1427 100     Resp 02/05/17 1427 20     Temp 02/05/17 1433 97.8 F (36.6 C)     Temp Source 02/05/17 1433 Oral     SpO2 02/05/17 1427 100 %     Weight 02/05/17 1427 220 lb (99.8 kg)  Height 02/05/17 1427 6' (1.829 m)     Head Circumference --      Peak Flow --      Pain Score 02/05/17 1429 3     Pain Loc --      Pain Edu? --      Excl. in GC? --    No data found.   Updated Vital Signs BP (!) 158/88 (BP Location: Left Arm)   Pulse 100   Temp 97.8 F (36.6 C) (Oral)   Resp 20   Ht 6' (1.829 m)   Wt 220 lb (99.8 kg)   SpO2 100%   BMI 29.84 kg/m   Visual Acuity Right Eye Distance:   Left Eye Distance:   Bilateral Distance:    Right Eye Near:   Left Eye Near:    Bilateral Near:     Physical Exam  Constitutional: He is oriented to person, place, and time. He appears well-developed and well-nourished. No distress.  Cardiovascular: Normal rate, regular rhythm, normal heart sounds and intact distal pulses.   Pulmonary/Chest: Effort normal and breath sounds  normal. No respiratory distress. He has no wheezes. He has no rales.  Musculoskeletal:       Left upper arm: He exhibits no tenderness, no bony tenderness, no swelling, no edema, no deformity and no laceration.  Left upper back trapezius muscle tenderness to palpation and spasm; left upper extremity neurovascularly intact  Neurological: He is alert and oriented to person, place, and time. He displays normal reflexes. No cranial nerve deficit. He exhibits normal muscle tone. Coordination normal.  Skin: He is not diaphoretic.  Nursing note and vitals reviewed.    UC Treatments / Results  Labs (all labs ordered are listed, but only abnormal results are displayed) Labs Reviewed - No data to display  EKG  EKG Interpretation None       Radiology No results found.  Procedures Procedures (including critical care time)  Medications Ordered in UC Medications - No data to display   Initial Impression / Assessment and Plan / UC Course  I have reviewed the triage vital signs and the nursing notes.  Pertinent labs & imaging results that were available during my care of the patient were reviewed by me and considered in my medical decision making (see chart for details).       Final Clinical Impressions(s) / UC Diagnoses   Final diagnoses:  Essential hypertension, benign  Upper back strain, initial encounter    New Prescriptions Discharge Medication List as of 02/05/2017  2:47 PM    START taking these medications   Details  cyclobenzaprine (FLEXERIL) 10 MG tablet Take 1 tablet (10 mg total) by mouth at bedtime., Starting Sat 02/05/2017, Normal    lisinopril (PRINIVIL,ZESTRIL) 10 MG tablet Take 1 tablet (10 mg total) by mouth daily., Starting Sat 02/05/2017, Normal       1.diagnosis reviewed with patient 2. rx as per orders above; reviewed possible side effects, interactions, risks and benefits  3. Follow-up with PCP  4. F/u here prn if symptoms worsen or don't  improve  Controlled Substance Prescriptions [place] Controlled Substance Registry consulted? Not Applicable   Payton Mccallum, MD 02/05/17 1539    Payton Mccallum, MD 02/05/17 [phone]

## 2017-07-21 ENCOUNTER — Ambulatory Visit
Admission: RE | Admit: 2017-07-21 | Discharge: 2017-07-21 | Disposition: A | Discharge status: Home / Self Care | Payer: Medicaid Other | Source: Ambulatory Visit | Attending: Orthopedic Surgery | Admitting: Orthopedic Surgery

## 2017-07-21 ENCOUNTER — Other Ambulatory Visit: Payer: Self-pay | Admitting: Orthopedic Surgery

## 2017-07-21 DIAGNOSIS — M25561 Pain in right knee: Secondary | ICD-10-CM | POA: Diagnosis present

## 2017-07-21 DIAGNOSIS — Z96651 Presence of right artificial knee joint: Secondary | ICD-10-CM | POA: Diagnosis not present

## 2017-07-21 DIAGNOSIS — R52 Pain, unspecified: Secondary | ICD-10-CM

## 2017-07-21 DIAGNOSIS — M25562 Pain in left knee: Secondary | ICD-10-CM | POA: Insufficient documentation

## 2017-07-21 DIAGNOSIS — M1712 Unilateral primary osteoarthritis, left knee: Secondary | ICD-10-CM

## 2017-07-27 ENCOUNTER — Other Ambulatory Visit: Payer: Self-pay | Admitting: Orthopedic Surgery

## 2017-07-27 DIAGNOSIS — M25562 Pain in left knee: Secondary | ICD-10-CM

## 2017-08-03 ENCOUNTER — Ambulatory Visit
Admission: RE | Admit: 2017-08-03 | Discharge: 2017-08-03 | Disposition: A | Discharge status: Home / Self Care | Payer: Medicaid Other | Source: Ambulatory Visit | Attending: Orthopedic Surgery | Admitting: Orthopedic Surgery

## 2017-08-03 DIAGNOSIS — M25562 Pain in left knee: Secondary | ICD-10-CM | POA: Diagnosis present

## 2017-08-03 DIAGNOSIS — S83232A Complex tear of medial meniscus, current injury, left knee, initial encounter: Secondary | ICD-10-CM | POA: Insufficient documentation

## 2018-04-16 ENCOUNTER — Emergency Department
Admission: EM | Admit: 2018-04-16 | Discharge: 2018-04-16 | Disposition: A | Discharge status: Home / Self Care | Payer: Medicaid Other | Attending: Emergency Medicine | Admitting: Emergency Medicine

## 2018-04-16 ENCOUNTER — Encounter: Payer: Self-pay | Admitting: Emergency Medicine

## 2018-04-16 ENCOUNTER — Emergency Department: Payer: Medicaid Other

## 2018-04-16 DIAGNOSIS — F17210 Nicotine dependence, cigarettes, uncomplicated: Secondary | ICD-10-CM | POA: Insufficient documentation

## 2018-04-16 DIAGNOSIS — Y9301 Activity, walking, marching and hiking: Secondary | ICD-10-CM | POA: Diagnosis not present

## 2018-04-16 DIAGNOSIS — Y929 Unspecified place or not applicable: Secondary | ICD-10-CM | POA: Insufficient documentation

## 2018-04-16 DIAGNOSIS — E119 Type 2 diabetes mellitus without complications: Secondary | ICD-10-CM | POA: Insufficient documentation

## 2018-04-16 DIAGNOSIS — Z79899 Other long term (current) drug therapy: Secondary | ICD-10-CM | POA: Insufficient documentation

## 2018-04-16 DIAGNOSIS — I10 Essential (primary) hypertension: Secondary | ICD-10-CM | POA: Diagnosis not present

## 2018-04-16 DIAGNOSIS — S5001XA Contusion of right elbow, initial encounter: Secondary | ICD-10-CM | POA: Diagnosis not present

## 2018-04-16 DIAGNOSIS — Y999 Unspecified external cause status: Secondary | ICD-10-CM | POA: Diagnosis not present

## 2018-04-16 DIAGNOSIS — S59901A Unspecified injury of right elbow, initial encounter: Secondary | ICD-10-CM | POA: Diagnosis present

## 2018-04-16 DIAGNOSIS — W0110XA Fall on same level from slipping, tripping and stumbling with subsequent striking against unspecified object, initial encounter: Secondary | ICD-10-CM | POA: Diagnosis not present

## 2018-04-16 DIAGNOSIS — Z7984 Long term (current) use of oral hypoglycemic drugs: Secondary | ICD-10-CM | POA: Diagnosis not present

## 2018-04-16 DIAGNOSIS — J45909 Unspecified asthma, uncomplicated: Secondary | ICD-10-CM | POA: Diagnosis not present

## 2018-04-16 NOTE — ED Triage Notes (Signed)
Patient presents to the ED with painful and swollen right elbow.  Patient states he slipped and fell and hit his elbow on Thursday.  Patient reports pain and swelling since that time.  Patient is in no obvious distress at this time.

## 2018-04-16 NOTE — ED Provider Notes (Signed)
[facility] Emergency Department Provider Note  ____________________________________________   First MD Initiated Contact with Patient 04/16/18 1647     (approximate)  I have reviewed the triage vital signs and the nursing notes.   HISTORY  Chief Complaint Elbow Pain    HPI Karl Deman. is a 57 y.o. male ports emergency department complaining of right elbow pain.  He states he slipped and fell hitting his elbow on Thursday of last week.  He said pain and swelling since then.  He denies any numbness or tingling.  He states if he puts pressure on the elbow that the pain radiates to the forearm.  He denies any other injuries.    Past Medical History:  Diagnosis Date  . Arthritis   . Asthma   . Degenerative arthritis   . Depression   . Diabetes mellitus without complication (HCC)   . Hypertension     There are no active problems to display for this patient.   Past Surgical History:  Procedure Laterality Date  . BACK SURGERY    . HAND SURGERY    . KNEE SURGERY    . SHOULDER SURGERY      Prior to Admission medications   Medication Sig Start Date End Date Taking? Authorizing Provider  beclomethasone (QVAR) 40 MCG/ACT inhaler Inhale 2 puffs into the lungs 2 (two) times daily.    [provider]  brompheniramine-pseudoephedrine-DM 30-2-10 MG/5ML syrup Take 5 mLs by mouth 4 (four) times daily as needed. 05/17/16   Joni Reining, PA-C  cyclobenzaprine (FLEXERIL) 10 MG tablet Take 1 tablet (10 mg total) by mouth at bedtime. 02/05/17   Payton Mccallum, MD  DULoxetine (CYMBALTA) 30 MG capsule Take 30 mg by mouth 2 (two) times daily.    [provider]  glimepiride (AMARYL) 1 MG tablet Take 1 mg by mouth daily with breakfast.    [provider]  lisinopril (PRINIVIL,ZESTRIL) 10 MG tablet Take 1 tablet (10 mg total) by mouth daily. 02/05/17   Payton Mccallum, MD  metFORMIN (GLUCOPHAGE) 1000 MG tablet Take 1,000 mg by  mouth 2 (two) times daily with a meal.    [provider]  oxyCODONE-acetaminophen (PERCOCET/ROXICET) 5-325 MG per tablet Take 1 tablet by mouth every 4 (four) hours as needed for severe pain. Patient not taking: Reported on 07/02/2015 02/22/15   Darien Ramus, MD  pantoprazole (PROTONIX) 40 MG tablet Take 40 mg by mouth daily.    [provider]  sulfamethoxazole-trimethoprim (BACTRIM DS,SEPTRA DS) 800-160 MG tablet Take 1 tablet by mouth 2 (two) times daily. 05/17/16   Joni Reining, PA-C    Allergies Codeine  No family history on file.  Social History Social History   Tobacco Use  . Smoking status: Current Every Day Smoker    Packs/day: 1.00    Types: Cigarettes  . Smokeless tobacco: Never Used  Substance Use Topics  . Alcohol use: No  . Drug use: No    Review of Systems  Constitutional: No fever/chills Eyes: No visual changes. ENT: No sore throat. Respiratory: Denies cough Genitourinary: Negative for dysuria. Musculoskeletal: Negative for back pain.  Positive for right elbow pain Skin: Negative for rash.    ____________________________________________   PHYSICAL EXAM:  VITAL SIGNS: ED Triage Vitals  Enc Vitals Group     BP 04/16/18 1550 (!) 157/86     Pulse Rate 04/16/18 1550 94     Resp 04/16/18 1550 18     Temp 04/16/18  1550 97.7 F (36.5 C)     Temp Source 04/16/18 1550 Oral     SpO2 04/16/18 1550 99 %     Weight --      Height --      Head Circumference --      Peak Flow --      Pain Score 04/16/18 1553 4     Pain Loc --      Pain Edu? --      Excl. in GC? --     Constitutional: Alert and oriented. Well appearing and in no acute distress. Eyes: Conjunctivae are normal.  Head: Atraumatic. Nose: No congestion/rhinnorhea. Mouth/Throat: Mucous membranes are moist.   Neck:  supple no lymphadenopathy noted Cardiovascular: Normal rate, regular rhythm. Respiratory: Normal respiratory effort.  No retractions,  GU:  deferred Musculoskeletal: FROM all extremities, warm and well perfused.  The right elbow is mildly tender medially.  Mild swelling is noted.  Neurovascular is intact. Neurologic:  Normal speech and language.  Skin:  Skin is warm, dry and intact. No rash noted. Psychiatric: Mood and affect are normal. Speech and behavior are normal.  ____________________________________________   LABS (all labs ordered are listed, but only abnormal results are displayed)  Labs Reviewed - No data to display ____________________________________________   ____________________________________________  RADIOLOGY  X-ray of the right elbow is negative for fracture  ____________________________________________   PROCEDURES  Procedure(s) performed: A sling was applied by the tech.  Procedures    ____________________________________________   INITIAL IMPRESSION / ASSESSMENT AND PLAN / ED COURSE  Pertinent labs & imaging results that were available during my care of the patient were reviewed by me and considered in my medical decision making (see chart for details).   Patient is a 57 year old male presents emergency department after a fall landing on his elbow.  On physical exam the right elbow swollen and tender.  Remainder exam is unremarkable.  X-ray of the right elbow is negative.  Explained the findings to the patient.  He was given a sling for comfort.  He was to apply ice to the right elbow.  Follow-up with orthopedics if not better 5 7 days.  He was is to continue taking ibuprofen for pain.  He states he understands will comply.  He was discharged in stable condition.     As part of my medical decision making, I reviewed the following data within the electronic MEDICAL RECORD NUMBER Nursing notes reviewed and incorporated, Old chart reviewed, Radiograph reviewed x-ray of the right elbow is negative, Notes from prior ED visits and [place] Controlled Substance  Database  ____________________________________________   FINAL CLINICAL IMPRESSION(S) / ED DIAGNOSES  Final diagnoses:  Contusion of right elbow, initial encounter      NEW MEDICATIONS STARTED DURING THIS VISIT:  New Prescriptions   No medications on file     Note:  This document was prepared using Dragon voice recognition software and may include unintentional dictation errors.    Faythe Ghee, PA-C 04/16/18 1800    Phineas Semen, MD 04/16/18 [phone]

## 2018-04-16 NOTE — Discharge Instructions (Addendum)
Follow-up with your regular doctor if not better in 3 to 5 days.  Return emergency department as needed.

## 2018-04-16 NOTE — ED Notes (Signed)
AAOx3.  Skin warm and dry.  NAD 

## 2018-06-17 ENCOUNTER — Ambulatory Visit
Admission: EM | Admit: 2018-06-17 | Discharge: 2018-06-17 | Disposition: A | Discharge status: Home / Self Care | Payer: Medicaid Other | Attending: Emergency Medicine | Admitting: Emergency Medicine

## 2018-06-17 ENCOUNTER — Other Ambulatory Visit: Payer: Self-pay

## 2018-06-17 DIAGNOSIS — M7542 Impingement syndrome of left shoulder: Secondary | ICD-10-CM | POA: Insufficient documentation

## 2018-06-17 MED ORDER — NAPROXEN 500 MG PO TABS: 500.0000 mg | ORAL_TABLET | Freq: Two times a day (BID) | ORAL | 0 refills | Status: DC

## 2018-06-17 NOTE — ED Triage Notes (Signed)
Patient states that he has a history of bursitis and thinks it has flared on his left shoulder.

## 2018-06-17 NOTE — ED Provider Notes (Signed)
[facility]    CSN: [id number] Arrival date & time: 06/17/18  1028     History   Chief Complaint Chief Complaint  Patient presents with  . Shoulder Pain    left    HPI Karl MediaGeorge Patrick Bowerman Jr. is a 58 y.o. male.   HPI  58 year old male presents with left shoulder pain that is worse with any motion.  His neck is not a bother.  History of bursitis and impingement syndrome and thinks that he may have flared it up.  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  The last couple of months they have been Extremely busy denies numbness or tingling.  He does state that on occasion the pain will radiate down to his elbow.  He states that lying down at night  to sleep is very uncomfortable.       Past Medical History:  Diagnosis Date  . Arthritis   . Asthma   . Degenerative arthritis   . Depression   . Diabetes mellitus without complication (HCC)   . Hypertension     There are no active problems to display for this patient.   Past Surgical History:  Procedure Laterality Date  . BACK SURGERY    . HAND SURGERY    . KNEE SURGERY    . SHOULDER SURGERY         Home Medications    Prior to Admission medications   Medication Sig Start Date End Date Taking? Authorizing Provider  beclomethasone (QVAR) 40 MCG/ACT inhaler Inhale 2 puffs into the lungs 2 (two) times daily.   Yes [provider]  DULoxetine (CYMBALTA) 30 MG capsule Take 30 mg by mouth 2 (two) times daily.   Yes [provider]  glimepiride (AMARYL) 1 MG tablet Take 1 mg by mouth daily with breakfast.   Yes [provider]  lisinopril (PRINIVIL,ZESTRIL) 10 MG tablet Take 1 tablet (10 mg total) by mouth daily. 02/05/17  Yes Payton Mccallumonty, [place], MD  metFORMIN (GLUCOPHAGE) 1000 MG tablet Take 1,000 mg by mouth 2 (two) times daily with a meal.   Yes [provider]  pantoprazole (PROTONIX) 40 MG tablet Take 40 mg by mouth daily.   Yes [provider]    naproxen (NAPROSYN) 500 MG tablet Take 1 tablet (500 mg total) by mouth 2 (two) times daily with a meal. 06/17/18   Lutricia Feiloemer, Andreika Vandagriff P, PA-C    Family History History reviewed. No pertinent family history.  Social History Social History   Tobacco Use  . Smoking status: Former Games developermoker  . Smokeless tobacco: Never Used  Substance Use Topics  . Alcohol use: No  . Drug use: No     Allergies   Codeine   Review of Systems Review of Systems  Constitutional: Positive for activity change. Negative for appetite change, chills, fatigue and fever.  Musculoskeletal: Positive for arthralgias.  All other systems reviewed and are negative.    Physical Exam Triage Vital Signs ED Triage Vitals  Enc Vitals Group     BP 06/17/18 1044 138/88     Pulse Rate 06/17/18 1044 (!) 104     Resp 06/17/18 1044 18     Temp 06/17/18 1044 98 F (36.7 C)     Temp Source 06/17/18 1044 Oral     SpO2 06/17/18 1044 99 %     Weight 06/17/18 1041 218 lb (98.9 kg)     Height 06/17/18 1041 6' (1.829 m)     Head  Circumference --      Peak Flow --      Pain Score 06/17/18 1040 8     Pain Loc --      Pain Edu? --      Excl. in GC? --    No data found.  Updated Vital Signs BP 138/88 (BP Location: Left Arm)   Pulse (!) 104   Temp 98 F (36.7 C) (Oral)   Resp 18   Ht 6' (1.829 m)   Wt 218 lb (98.9 kg)   SpO2 99%   BMI 29.57 kg/m   Visual Acuity Right Eye Distance:   Left Eye Distance:   Bilateral Distance:    Right Eye Near:   Left Eye Near:    Bilateral Near:     Physical Exam Vitals signs and nursing note reviewed.  Constitutional:      General: He is not in acute distress.    Appearance: Normal appearance. He is not ill-appearing, toxic-appearing or diaphoretic.  HENT:     Head: Normocephalic.     Mouth/Throat:     Mouth: Mucous membranes are moist.  Eyes:     General:        Right eye: No discharge.        Left eye: No discharge.     Conjunctiva/sclera: Conjunctivae normal.   Neck:     Musculoskeletal: Normal range of motion and neck supple. No neck rigidity or muscular tenderness.  Musculoskeletal:        General: Tenderness present.     Comments: Neck range of motion is full and comfortable.  No tenderness of the clavicle.  There is no acromioclavicular joint tenderness bilateral.  She does have subacromial tenderness as well as the over the coracoid process.  Motion actively to 80 degrees abduction or rotation full external rotation to 20degrees.  He has a positive arm drop test.  Negative empty can test.  Skin:    General: Skin is warm and dry.  Neurological:     General: No focal deficit present.     Mental Status: He is alert and oriented to person, place, and time.  Psychiatric:        Mood and Affect: Mood normal.        Behavior: Behavior normal.        Thought Content: Thought content normal.        Judgment: Judgment normal.      UC Treatments / Results  Labs (all labs ordered are listed, but only abnormal results are displayed) Labs Reviewed - No data to display  EKG None  Radiology No results found.  Procedures Procedures (including critical care time)  Medications Ordered in UC Medications - No data to display  Initial Impression / Assessment and Plan / UC Course  I have reviewed the triage vital signs and the nursing notes.  Pertinent labs & imaging results that were available during my care of the patient were reviewed by me and considered in my medical decision making (see chart for details).   Patient has impingement syndrome of the left shoulder.  We will place him on Naprosyn twice daily with meals.  Him with a sling for comfort.  He was fully instructed in pendulum exercises which she will perform 3-4 times daily for 2 to 3 minutes at a time.  He continues to have problems he will need follow-up with orthopedics.   Final Clinical Impressions(s) / UC Diagnoses   Final diagnoses:  Impingement syndrome of left  shoulder      Discharge Instructions     Apply ice 20 minutes out of every 2 hours 4-5 times daily for comfort.  Perform the pendulum exercises demonstrated to you 3-4 times daily for 1 to 2 minutes each time    ED Prescriptions    Medication Sig Dispense Auth. Provider   naproxen (NAPROSYN) 500 MG tablet Take 1 tablet (500 mg total) by mouth 2 (two) times daily with a meal. 60 tablet Lutricia Feiloemer, Melissia Lahman P, PA-C     Controlled Substance Prescriptions [place] Controlled Substance Registry consulted? Not Applicable   Lutricia FeilRoemer, Saarah Dewing P, PA-C 06/17/18 1233

## 2018-06-17 NOTE — Discharge Instructions (Addendum)
Apply ice 20 minutes out of every 2 hours 4-5 times daily for comfort.  Perform the pendulum exercises demonstrated to you 3-4 times daily for 1 to 2 minutes each time

## 2019-02-08 ENCOUNTER — Other Ambulatory Visit: Payer: Self-pay

## 2019-02-08 ENCOUNTER — Encounter: Payer: Self-pay | Admitting: Emergency Medicine

## 2019-02-08 ENCOUNTER — Ambulatory Visit
Admission: EM | Admit: 2019-02-08 | Discharge: 2019-02-08 | Disposition: A | Discharge status: Home / Self Care | Payer: Medicaid Other | Attending: Urgent Care | Admitting: Urgent Care

## 2019-02-08 DIAGNOSIS — L02219 Cutaneous abscess of trunk, unspecified: Secondary | ICD-10-CM | POA: Diagnosis not present

## 2019-02-08 DIAGNOSIS — E119 Type 2 diabetes mellitus without complications: Secondary | ICD-10-CM | POA: Diagnosis not present

## 2019-02-08 DIAGNOSIS — L03319 Cellulitis of trunk, unspecified: Secondary | ICD-10-CM | POA: Diagnosis not present

## 2019-02-08 MED ORDER — LIDOCAINE-EPINEPHRINE 1 %-1:100000 IJ SOLN
10.0000 mL | Freq: Once | INTRAMUSCULAR | Status: AC
  Administered 2019-02-08: 14:00:00 10 mL

## 2019-02-08 MED ORDER — SULFAMETHOXAZOLE-TRIMETHOPRIM 800-160 MG PO TABS: 1.0000 | ORAL_TABLET | Freq: Two times a day (BID) | ORAL | 0 refills | Status: AC

## 2019-02-08 NOTE — Discharge Instructions (Addendum)
It was very nice seeing you today in clinic. Thank you for entrusting me with your care.   Keep area clean and dry. Leave packing in until your return here in 2 days.   Please utilize the medications that we discussed. Your prescriptions have been called in to your pharmacy.   Make arrangements to follow up with your regular doctor in 1 week for re-evaluation if not improving. If your symptoms/condition worsens, please seek follow up care either here or in the ER. Please remember, our [place] providers are "right here with you" when you need [place].   Again, it was my pleasure to take care of you today. Thank you for choosing our clinic. I hope that you start to feel better quickly.   Honor Loh, MSN, APRN, FNP-C, CEN Advanced Practice Provider [facility]

## 2019-02-08 NOTE — ED Provider Notes (Signed)
Weaver, Karl   Name: Karl MediaGeorge Patrick Wiest Jr. DOB: 04-04-1961 MRN: [id number] CSN: [id number] PCP: Val RilesEvans, Kelly L, MD  Arrival date and time:  02/08/19 1141  Chief Complaint:  Wound Infection   NOTE: Prior to seeing the patient today, I have reviewed the triage nursing documentation and vital signs. Clinical staff has updated patient's PMH/PSHx, current medication list, and drug allergies/intolerances to ensure comprehensive history available to assist in medical decision making.   History:   HPI: Karl MediaGeorge Patrick Kienast Jr. is a 58 y.o. male who presents today with complaints of an abscess to his lower back. Area of concern declared on Monday and and progressively worsened. Patient reports exquisite tenderness to the area, and notes that it feel hot to touch. He has appreciated some purulent drainage, however area has primarily been draining serous fluid. He denies any associated fevers. PMH (+) for recurrent skin abscesses in the past.   Past Medical History:  Diagnosis Date  . Arthritis   . Asthma   . Degenerative arthritis   . Depression   . Diabetes mellitus without complication (HCC)   . Hypertension     Past Surgical History:  Procedure Laterality Date  . BACK SURGERY    . HAND SURGERY    . KNEE SURGERY    . SHOULDER SURGERY      History reviewed. No pertinent family history.  Social History   Tobacco Use  . Smoking status: Current Some Day Smoker  . Smokeless tobacco: Never Used  Substance Use Topics  . Alcohol use: No  . Drug use: No    There are no active problems to display for this patient.   Home Medications:    Current Meds  Medication Sig  . beclomethasone (QVAR) 40 MCG/ACT inhaler Inhale 2 puffs into the lungs 2 (two) times daily.  Marland Kitchen. glimepiride (AMARYL) 1 MG tablet Take 1 mg by mouth daily with breakfast.  . lisinopril (PRINIVIL,ZESTRIL) 10 MG tablet Take 1 tablet (10 mg total) by mouth daily. (Patient taking differently: Take 40 mg by mouth  daily. )  . metFORMIN (GLUCOPHAGE) 1000 MG tablet Take 1,000 mg by mouth 2 (two) times daily with a meal.  . pantoprazole (PROTONIX) 40 MG tablet Take 40 mg by mouth daily.  . [DISCONTINUED] naproxen (NAPROSYN) 500 MG tablet Take 1 tablet (500 mg total) by mouth 2 (two) times daily with a meal.    Allergies:   Codeine  Review of Systems (ROS): Review of Systems  Constitutional: Negative for chills and fever.  Respiratory: Negative for cough and shortness of breath.   Cardiovascular: Negative for chest pain and palpitations.  Skin: Positive for color change and wound.  All other systems reviewed and are negative.    Vital Signs: Today's Vitals   02/08/19 1203 02/08/19 1206  BP:  134/79  Pulse:  (!) 108  Resp:  18  Temp:  98.1 F (36.7 C)  TempSrc:  Oral  SpO2:  100%  Weight: 215 lb (97.5 kg)   Height: 6' (1.829 m)   PainSc: 8      Physical Exam: Physical Exam  Constitutional: He is oriented to person, place, and time and well-developed, well-nourished, and in no distress.  HENT:  Head: Normocephalic and atraumatic.  Mouth/Throat: Mucous membranes are normal.  Neck: Normal range of motion. Neck supple. No tracheal deviation present.  Cardiovascular: Regular rhythm, normal heart sounds and intact distal pulses. Tachycardia present. Exam reveals no gallop and no friction rub.  No murmur heard.  Pulmonary/Chest: Effort normal and breath sounds normal. No respiratory distress. He has no wheezes. He has no rales.  Lymphadenopathy:    He has no cervical adenopathy.  Neurological: He is alert and oriented to person, place, and time. Gait normal. GCS score is 15.  Skin: Skin is warm and dry. Lesion noted.     Psychiatric: Mood, memory, affect and judgment normal.  Nursing note and vitals reviewed.    Urgent Care Treatments / Results:   LABS: PLEASE NOTE: all labs that were ordered this encounter are listed, however only abnormal results are displayed. Labs Reviewed - No  data to display  EKG: -None  RADIOLOGY: No results found.  PROCEDURES: Incision and Drainage Performed by: Verlee Monte, NP Authorized by: Verlee Monte, NP   Consent:    Consent obtained:  Verbal   Consent given by:  Patient   Risks discussed:  Bleeding, incomplete drainage, pain and infection   Alternatives discussed:  Alternative treatment and referral Location:    Type:  Abscess   Size:  9 x 6.5 cm   Location: LEFT lower back. Pre-procedure details:    Skin preparation:  Antiseptic wash and Betadine Anesthesia (see MAR for exact dosages):    Anesthesia method:  Local infiltration   Local anesthetic:  Lidocaine 1% WITH epi Procedure type:    Complexity:  Complex Procedure details:    Needle aspiration: yes     Needle size:  18 G   Incision types:  Single straight   Incision depth:  Dermal   Scalpel blade:  10   Wound management:  Probed and deloculated, irrigated with saline and extensive cleaning   Drainage:  Bloody, purulent and serous   Drainage amount:  Copious   Wound treatment:  Wound left open   Packing materials:  1/2 in iodoform gauze Post-procedure details:    Patient tolerance of procedure:  Tolerated well, no immediate complications    MEDICATIONS RECEIVED THIS VISIT: Medications  lidocaine-EPINEPHrine (XYLOCAINE W/EPI) 1 %-1:100000 (with pres) injection 10 mL (10 mLs Infiltration Given 02/08/19 1343)    PERTINENT CLINICAL COURSE NOTES/UPDATES:   Initial Impression / Assessment and Plan / Urgent Care Course:  Pertinent labs & imaging results that were available during my care of the patient were personally reviewed by me and considered in my medical decision making (see lab/imaging section of note for values and interpretations).  Karl Auger. is a 58 y.o. male who presents to [facility] today with complaints of Wound Infection   Patient is well appearing overall in clinic today. He does not appear to be in any acute  distress. Presenting symptoms (see HPI) and exam as documented above. Large abscess to LEFT side of lower back opened and drained as per above. Area packed and dressed. He was instructed on wound care. Patient to RTC in 2 days for a wound check and to have packing removed. He was asked to monitor for signs and symptoms of infection, which would include increased redness, swelling, streaking, drainage, pain, and the development of a fever. Discussed likely MRSA infection. Will cover with 10 day course of SMZ-TMP DS. Patient to use warm compresses at home to help with the associated inflammation. May use Tylenol and/or Ibuprofen as needed for pain/fever.   Discussed follow up with primary care physician in 1 week for re-evaluation. I have reviewed the follow up and strict return precautions for any new or worsening symptoms. Patient is aware of symptoms that would be  deemed urgent/emergent, and would thus require further evaluation either here or in the emergency department. At the time of discharge, he verbalized understanding and consent with the discharge plan as it was reviewed with him. All questions were fielded by provider and/or clinic staff prior to patient discharge.    Final Clinical Impressions / Urgent Care Diagnoses:   Final diagnoses:  Cellulitis and abscess of trunk    New Prescriptions:  [place] Controlled Substance Registry consulted? Not Applicable  Meds ordered this encounter  Medications  . sulfamethoxazole-trimethoprim (BACTRIM DS) 800-160 MG tablet    Sig: Take 1 tablet by mouth 2 (two) times daily for 10 days.    Dispense:  20 tablet    Refill:  0  . lidocaine-EPINEPHrine (XYLOCAINE W/EPI) 1 %-1:100000 (with pres) injection 10 mL    Recommended Follow up Care:  Patient encouraged to follow up with the following provider within the specified time frame, or sooner as dictated by the severity of his symptoms. As always, he was instructed that for any urgent/emergent care needs,  he should seek care either here or in the emergency department for more immediate evaluation.  Follow-up [place] In 2 days.   Specialty: Urgent Care Why: For wound re-check Contact information: [place] [id number] [phone]        NOTE: This note was prepared using Dragon dictation software along with smaller phrase technology. Despite my best ability to proofread, there is the potential that transcriptional errors may still occur from this process, and are completely unintentional.    Karen Kitchens, NP 02/08/19 1406

## 2019-02-08 NOTE — ED Triage Notes (Signed)
Patient states he has an abscess to his left lower back that started on Monday. The area is draining purulent yellow drainage.

## 2019-02-08 NOTE — ED Notes (Signed)
Applied dressing

## 2019-02-10 ENCOUNTER — Ambulatory Visit
Admission: EM | Admit: 2019-02-10 | Discharge: 2019-02-10 | Disposition: A | Discharge status: Home / Self Care | Payer: Medicaid Other | Attending: Urgent Care | Admitting: Urgent Care

## 2019-02-10 ENCOUNTER — Encounter: Payer: Self-pay | Admitting: Emergency Medicine

## 2019-02-10 ENCOUNTER — Other Ambulatory Visit: Payer: Self-pay

## 2019-02-10 DIAGNOSIS — Z5189 Encounter for other specified aftercare: Secondary | ICD-10-CM

## 2019-02-10 MED ORDER — CHLORHEXIDINE GLUCONATE 4 % EX LIQD
Freq: Once | CUTANEOUS | Status: AC
Start: 2019-02-10 — End: 2019-02-10
  Administered 2019-02-10: 15:00:00 via TOPICAL

## 2019-02-10 NOTE — ED Provider Notes (Addendum)
    Date: 02/10/2019  Name: Karl Weaver. DOB: Nov 04, 1960 MRN: [id number]  Re: Wound check  Abscess opened on 02/08/2019 resulting in copious amount of drainage. Wound packed and dressed in clinic. Patient prescribed a 10 day course of SMZ-TMP DS. He presents today for a wound check. Despite education to change dressing, original dressing remains in place. Dressing material saturated with dark serosanguinous drainage (see attached medical photograph).      Assessment and Plan: Wound healing nicely. Patient has no signs of systemic infection; no tachycardia, hypotension, fever, N/V. Packing removed today. Packing material removed. Wound flushed and cleansed thoroughly. Pressure applied to peri-wound area, with no continued drainage following wound care. Induration has markedly improved. Will defer re-packing wound at this time, allowing for healing by secondary intention. Will continue treatment as follows:  1. Patient provided with supply of 4% CHG to use for cleansing purposes at home.  -Discussed the importance of proper wound care BID to prevent infection and promote healing.  -He was provided with a supply of dressing materials to use at home.  2. Continue prescribed antibiotic. -He is on day 2 of the prescribed 10 day course of SMZ-TMP DS. Tolerating well.  3. Continue to monitor for signs of infection, (increased redness, swelling, streaking, drainage, pain, fever).   4. Follow up here or with PCP for any concerns pertaining to wound.    Karen Kitchens, NP 02/10/19 [phone]

## 2019-02-10 NOTE — ED Triage Notes (Signed)
Patient states that he is here for wound check and possible repacking to abscess that was opened up on 8/27.   Patient states that he is still having some drainage from the site. Patient denies fevers.

## 2019-02-10 NOTE — Discharge Instructions (Addendum)
Wound looks good. Use the provided soap for cleaning. Continue daily wound care and antibiotics. Monitor for signs and symptoms of infection, which would include increased redness, swelling, streaking, drainage, pain, and the development of a fever.  Call clinic for any concerns.  Follow up with PCP.   Honor Loh, MSN, APRN, FNP-C, CEN Advanced Practice Provider [facility]

## 2019-04-05 ENCOUNTER — Encounter: Payer: Self-pay | Admitting: Emergency Medicine

## 2019-04-05 ENCOUNTER — Other Ambulatory Visit: Payer: Self-pay

## 2019-04-05 ENCOUNTER — Ambulatory Visit
Admission: EM | Admit: 2019-04-05 | Discharge: 2019-04-05 | Disposition: A | Discharge status: Home / Self Care | Payer: BC Managed Care – PPO | Attending: Family Medicine | Admitting: Family Medicine

## 2019-04-05 DIAGNOSIS — J019 Acute sinusitis, unspecified: Secondary | ICD-10-CM | POA: Diagnosis not present

## 2019-04-05 DIAGNOSIS — Z7189 Other specified counseling: Secondary | ICD-10-CM | POA: Diagnosis not present

## 2019-04-05 DIAGNOSIS — B9689 Other specified bacterial agents as the cause of diseases classified elsewhere: Secondary | ICD-10-CM

## 2019-04-05 DIAGNOSIS — Z20828 Contact with and (suspected) exposure to other viral communicable diseases: Secondary | ICD-10-CM

## 2019-04-05 DIAGNOSIS — J029 Acute pharyngitis, unspecified: Secondary | ICD-10-CM | POA: Diagnosis not present

## 2019-04-05 DIAGNOSIS — R05 Cough: Secondary | ICD-10-CM

## 2019-04-05 DIAGNOSIS — Z20822 Contact with and (suspected) exposure to covid-19: Secondary | ICD-10-CM

## 2019-04-05 LAB — RAPID STREP SCREEN (MED CTR MEBANE ONLY): Streptococcus, Group A Screen (Direct): NEGATIVE

## 2019-04-05 MED ORDER — AMOXICILLIN-POT CLAVULANATE 875-125 MG PO TABS: 1.0000 | ORAL_TABLET | Freq: Two times a day (BID) | ORAL | 0 refills | Status: AC

## 2019-04-05 MED ORDER — BENZONATATE 100 MG PO CAPS: 100.0000 mg | ORAL_CAPSULE | Freq: Three times a day (TID) | ORAL | 0 refills | Status: DC

## 2019-04-05 NOTE — Discharge Instructions (Signed)
It was very nice seeing you today in clinic. Thank you for entrusting me with your care.   Rest and increased fluid intake. Please utilize the medications that we discussed. Your prescriptions has been called in to your pharmacy.   You were tested for SARS-CoV-2 (novel coronavirus) today. Testing is performed by an outside lab ([facility]) and has variable turn around times ranging between 2-5 days. Current recommendations from the the CDC and [place] DHHS require that you remain out of work in order to quarantine at home until negative test results are have been received. In the event that your test results are positive, you will be contacted with further directives. These measures are being implemented out of an abundance of caution to prevent transmission and spread during the current SARS-CoV-2 pandemic.  Make arrangements to follow up with your regular doctor in 1 week for re-evaluation if not improving. If your symptoms/condition worsens, please seek follow up care either here or in the ER. Please remember, our [facility] providers are "right here with you" when you need [place].   Again, it was my pleasure to take care of you today. Thank you for choosing our clinic. I hope that you start to feel better quickly.   Honor Loh, MSN, APRN, FNP-C, CEN Advanced Practice Provider [facility]

## 2019-04-05 NOTE — ED Triage Notes (Signed)
Patient in today c/o 2 week history of nasal congestion/running x 2 days. Patient also c/o 1 week history of sore throat, cough and sneezing. Patient denies fever.

## 2019-04-05 NOTE — ED Provider Notes (Signed)
[place], [place]   Name: Karl Weaver. DOB: 1960-10-28 MRN: [id number] CSN: [id number] PCP: Val Riles, MD  Arrival date and time:  04/05/19 [phone]  Chief Complaint:  Sore Throat and Cough   NOTE: Prior to seeing the patient today, I have reviewed the triage nursing documentation and vital signs. Clinical staff has updated patient's PMH/PSHx, current medication list, and drug allergies/intolerances to ensure comprehensive history available to assist in medical decision making.   History:   HPI: Karl Weaver. is a 58 y.o. male who presents today with complaints of cough and congestion that has been progressive over the course of the last 2 weeks. Patient denies any associated fevers. Cough has been non-productive and worse at night. Patient reporting a concurrent sore throat that he attributes to his coughing. He denies otalgia. Patient has a PMH that is significant for seasonal allergies and takes daily allergy medications. Despite use of his normal medications, he notes that his symptoms have continued to worsen. Patient presents today with complaints of significant frontal and paranasal sinus tenderness. He denies that he has experienced any nausea, vomiting, diarrhea, or abdominal pain. He is eating and drinking well. Patient denies any perceived alterations to his sense of taste or smell.  Patient denies being in close contact with anyone known to be ill. He was tested for SARS-CoV-2 (novel coronavirus) about 1 month ago; results were negative.  Past Medical History:  Diagnosis Date  . Arthritis   . Asthma   . Degenerative arthritis   . Depression   . Diabetes mellitus without complication (HCC)   . Hypertension     Past Surgical History:  Procedure Laterality Date  . BACK SURGERY    . HAND SURGERY    . KNEE SURGERY    . SHOULDER SURGERY      Family History  Problem Relation Age of Onset  . Asthma Mother   . Other Mother        brain tumor  . Brain  cancer Mother   . Lung cancer Father   . Liver cancer Father   . Hypertension Father     Social History   Tobacco Use  . Smoking status: Current Every Day Smoker    Packs/day: 0.50    Years: 30.00    Pack years: 15.00    Types: Cigarettes  . Smokeless tobacco: Never Used  Substance Use Topics  . Alcohol use: No  . Drug use: No    There are no active problems to display for this patient.   Home Medications:    Current Meds  Medication Sig  . beclomethasone (QVAR) 40 MCG/ACT inhaler Inhale 2 puffs into the lungs 2 (two) times daily.  Marland Kitchen glimepiride (AMARYL) 1 MG tablet Take 1 mg by mouth daily with breakfast.  . lisinopril (PRINIVIL,ZESTRIL) 10 MG tablet Take 1 tablet (10 mg total) by mouth daily. (Patient taking differently: Take 40 mg by mouth daily. )  . metFORMIN (GLUCOPHAGE) 1000 MG tablet Take 1,000 mg by mouth 2 (two) times daily with a meal.  . pantoprazole (PROTONIX) 40 MG tablet Take 40 mg by mouth daily.  . traMADol (ULTRAM) 50 MG tablet Take by mouth.    Allergies:   Codeine, Cyclobenzaprine, Diclofenac sodium, Esomeprazole magnesium, Nsaids, and Amitriptyline  Review of Systems (ROS): Review of Systems  Constitutional: Negative for fatigue and fever.  HENT: Positive for congestion, sinus pressure, sinus pain and sore throat. Negative for ear pain, postnasal drip, rhinorrhea and sneezing.  Eyes: Negative for pain, discharge and redness.  Respiratory: Positive for cough. Negative for chest tightness and shortness of breath.   Cardiovascular: Negative for chest pain and palpitations.  Gastrointestinal: Negative for abdominal pain, diarrhea, nausea and vomiting.  Musculoskeletal: Negative for arthralgias, back pain, myalgias and neck pain.  Skin: Negative for color change, pallor and rash.  Allergic/Immunologic: Positive for environmental allergies (seasonal).  Neurological: Negative for dizziness, syncope, weakness and headaches.  Hematological: Negative for  adenopathy.     Vital Signs: Today's Vitals   04/05/19 0934 04/05/19 0935 04/05/19 1019  BP: (!) 121/91    Pulse: (!) 104    Resp: 18    Temp: 97.9 F (36.6 C)    TempSrc: Oral    SpO2: 99%    Weight:  220 lb (99.8 kg)   Height:  6' (1.829 m)   PainSc: 5   5     Physical Exam: Physical Exam  Constitutional: He is oriented to person, place, and time and well-developed, well-nourished, and in no distress. No distress.  HENT:  Head: Normocephalic and atraumatic.  Right Ear: No tenderness. Tympanic membrane is injected. Tympanic membrane is not bulging.  Left Ear: No tenderness. Tympanic membrane is injected. Tympanic membrane is not bulging.  Nose: Mucosal edema, rhinorrhea and sinus tenderness present.  Mouth/Throat: Uvula is midline. Posterior oropharyngeal erythema present. No posterior oropharyngeal edema.  Eyes: Pupils are equal, round, and reactive to light. Conjunctivae and EOM are normal.  Neck: Normal range of motion. Neck supple.  Cardiovascular: Normal rate, regular rhythm, normal heart sounds and intact distal pulses. Exam reveals no gallop and no friction rub.  No murmur heard. Pulmonary/Chest: Effort normal. No respiratory distress. He has no decreased breath sounds. He has no wheezes. He has no rhonchi. He has no rales.  (+) dry cough  Abdominal: Soft. Normal appearance and bowel sounds are normal. He exhibits no distension. There is no abdominal tenderness.  Musculoskeletal: Normal range of motion.  Neurological: He is alert and oriented to person, place, and time. Gait normal.  Skin: Skin is warm and dry. No rash noted. He is not diaphoretic.  Psychiatric: Mood, memory, affect and judgment normal.  Nursing note and vitals reviewed.   Urgent Care Treatments / Results:   LABS: PLEASE NOTE: all labs that were ordered this encounter are listed, however only abnormal results are displayed. Labs Reviewed  RAPID STREP SCREEN ([facility] [place] ONLY)  NOVEL  CORONAVIRUS, NAA (HOSP ORDER, SEND-OUT TO REF LAB; TAT 18-24 HRS)  CULTURE, GROUP A STREP [facility]    EKG: -None  RADIOLOGY: No results found.  PROCEDURES: Procedures  MEDICATIONS RECEIVED THIS VISIT: Medications - No data to display  PERTINENT CLINICAL COURSE NOTES/UPDATES:   Initial Impression / Assessment and Plan / Urgent Care Course:  Pertinent labs & imaging results that were available during my care of the patient were personally reviewed by me and considered in my medical decision making (see lab/imaging section of note for values and interpretations).  Cletis MediaGeorge Patrick Deupree Jr. is a 58 y.o. male who [facility] with complaints of Sore Throat and Cough   Patient overall well appearing and in no acute distress today in clinic. Presenting symptoms (see HPI) and exam as documented above. He presents with symptoms associated with SARS-CoV-2 (novel coronavirus). Discussed typical symptom constellation. Reviewed potential for infection and need for testing. Patient amenable to being tested. SARS-CoV-2 swab collected by certified clinical staff. Discussed variable turn around times associated  with testing, as swabs are being processed at [facility], and have been taking between 2-5 days to come back. He was advised to self quarantine, per [facility] DHHS guidelines, until negative results received.   Rapid streptococcal throat swab (-); reflex culture sent. Patient has been using allergy medications at home with no improvement. Given his exam and chronicity of symptoms, will treat for an acute bacterial sinusitis with a 10 day course of Augmentin. Discussed supportive care measures at home during acute phase of illness. Patient to rest as much as possible. He was encouraged to ensure adequate hydration (water and ORS) to prevent dehydration and electrolyte derangements. Patient may use APAP and/or IBU on an as needed basis for pain/fever. Will also send in a supply of  benzonatate for PRN use.   Current clinical condition warrants patient being out of work in order to quarantine while waiting for testing results. He was provided with the appropriate documentation to provide to hisplace of employment that will allow for him to RTW on 04/07/2019 with no restrictions. RTW is contingent on his SARS-CoV-2 test results being reviewed as negative.    Discussed follow up with primary care physician in 1 week for re-evaluation. I have reviewed the follow up and strict return precautions for any new or worsening symptoms. Patient is aware of symptoms that would be deemed urgent/emergent, and would thus require further evaluation either here or in the emergency department. At the time of discharge, he verbalized understanding and consent with the discharge plan as it was reviewed with him. All questions were fielded by provider and/or clinic staff prior to patient discharge.    Final Clinical Impressions / Urgent Care Diagnoses:   Final diagnoses:  Acute bacterial sinusitis  Encounter for laboratory testing for COVID-19 virus  Advice given about COVID-19 virus infection    New Prescriptions:  [place] Controlled Substance Registry consulted? Not Applicable  Meds ordered this encounter  Medications  . amoxicillin-clavulanate (AUGMENTIN) 875-125 MG tablet    Sig: Take 1 tablet by mouth 2 (two) times daily for 10 days.    Dispense:  20 tablet    Refill:  0  . benzonatate (TESSALON) 100 MG capsule    Sig: Take 1 capsule (100 mg total) by mouth every 8 (eight) hours.    Dispense:  21 capsule    Refill:  0    Recommended Follow up Care:  Patient encouraged to follow up with the following provider within the specified time frame, or sooner as dictated by the severity of his symptoms. As always, he was instructed that for any urgent/emergent care needs, he should seek care either here or in the emergency department for more immediate evaluation.  Follow-up Information     Emelia Salisbury, MD In 1 week.   Specialty: Student Why: General reassessment of symptoms if not improving Contact information: [id number] [facility] [address] [phone]         NOTE: This note was prepared using Dragon dictation software along with smaller phrase technology. Despite my best ability to proofread, there is the potential that transcriptional errors may still occur from this process, and are completely unintentional.    Karen Kitchens, NP 04/05/19 1138

## 2019-04-06 LAB — NOVEL CORONAVIRUS, NAA (HOSP ORDER, SEND-OUT TO REF LAB; TAT 18-24 HRS): SARS-CoV-2, NAA: NOT DETECTED

## 2019-04-07 LAB — CULTURE, GROUP A STREP (THRC)

## 2019-10-07 IMAGING — CR DG ELBOW COMPLETE 3+V*R*
4 series · 4 of 4 positions shown · non-contrast
Comparison: None.

CLINICAL DATA: Right elbow pain after fall.

EXAM:
RIGHT ELBOW - COMPLETE 3+ VIEW

[elbow ap]
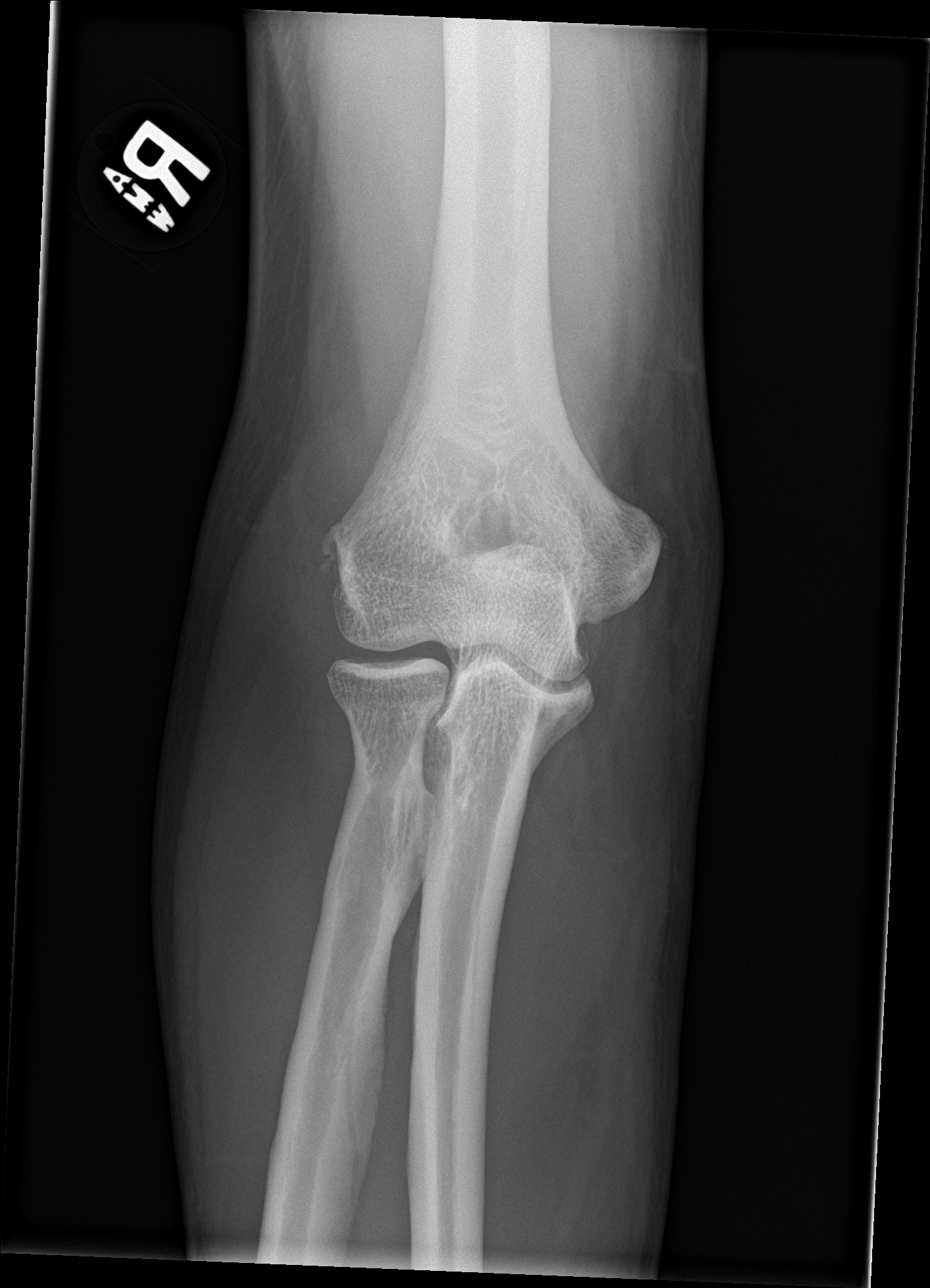

[elbow obl (1 of 2)]
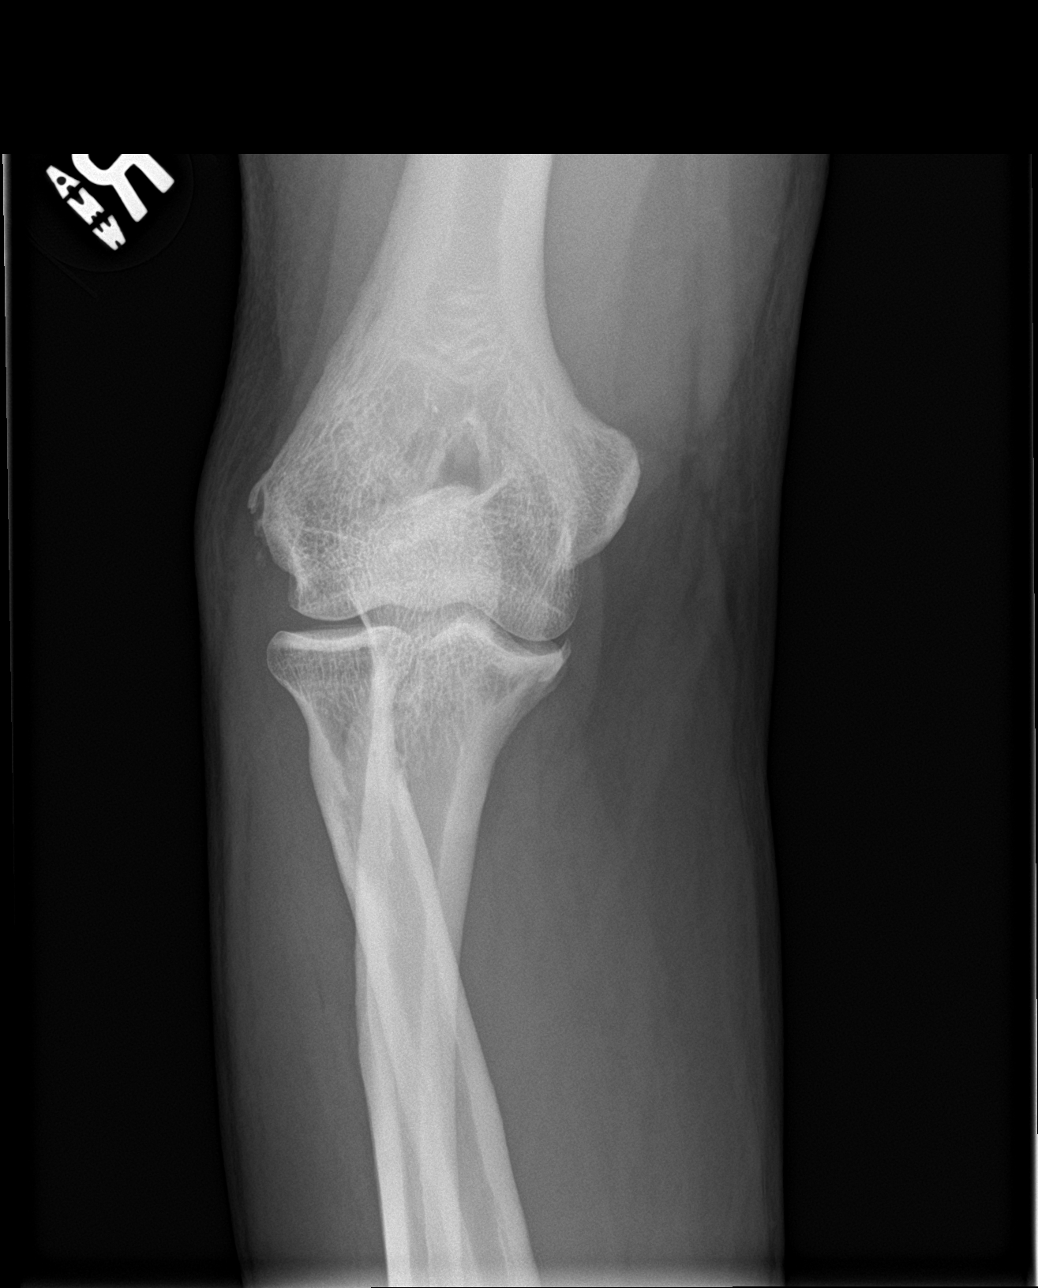

[elbow obl (2 of 2)]
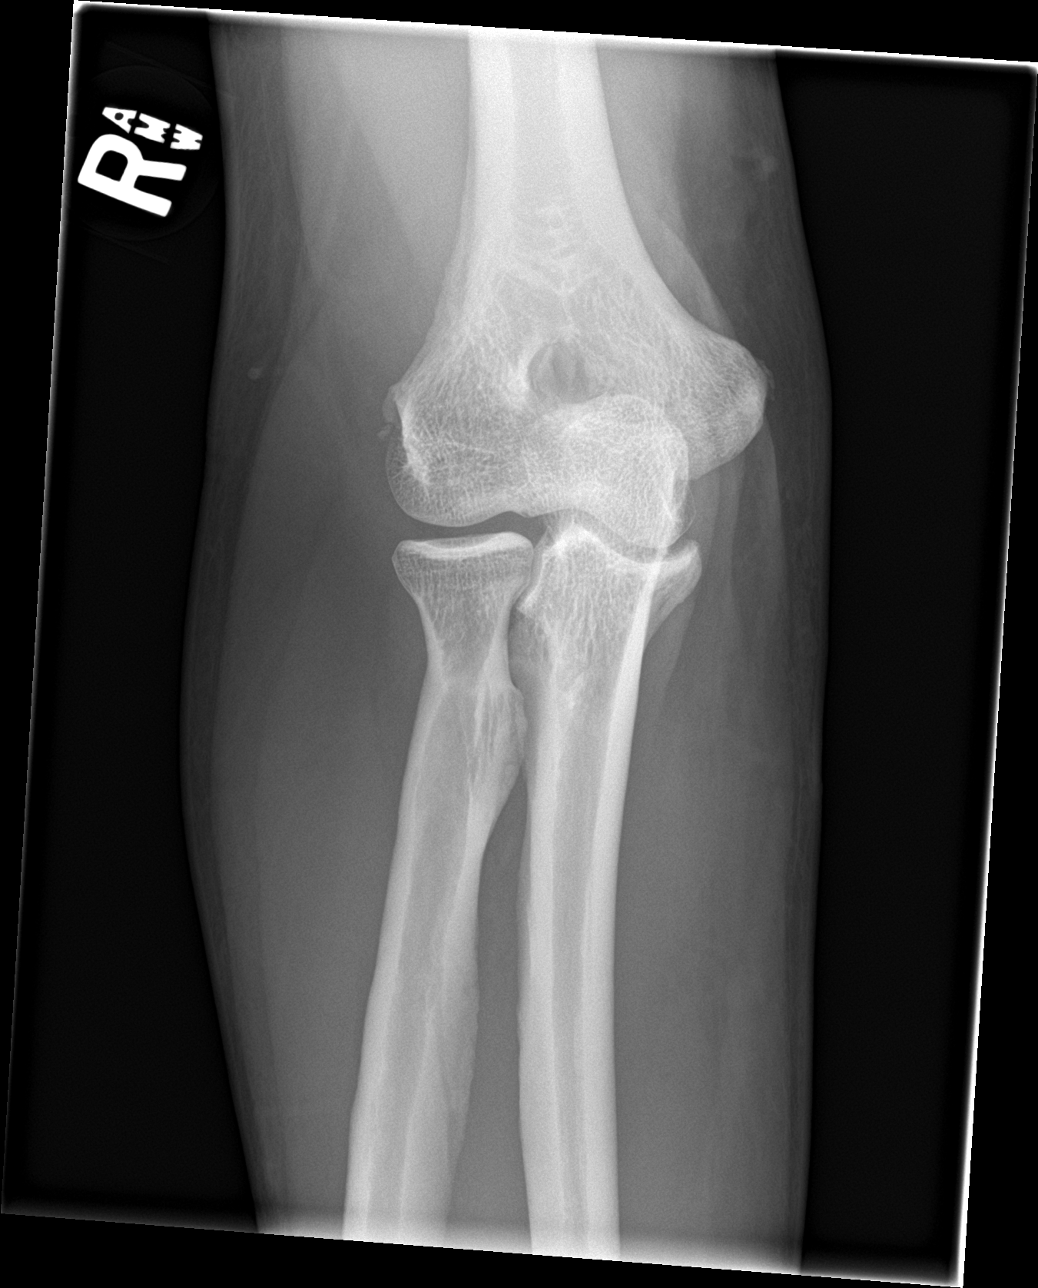

[elbow lat]
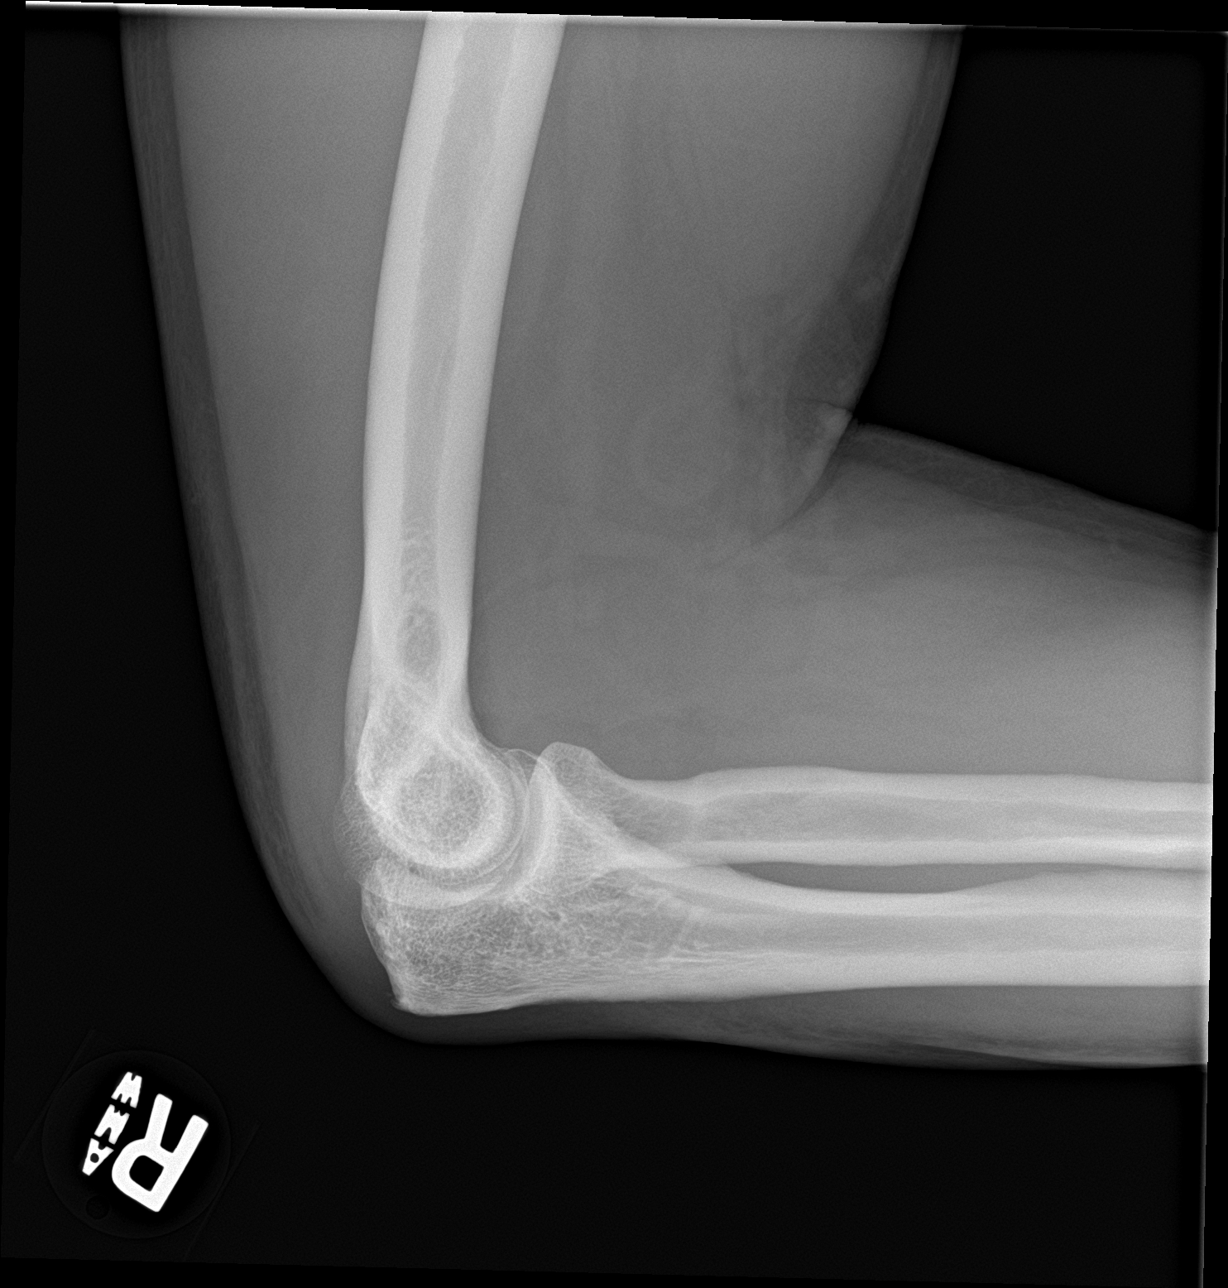

[4 of 4 positions shown; findings below may reference images not displayed]

FINDINGS: There is no evidence of fracture, dislocation, or joint effusion.
There is no evidence of arthropathy or other focal bone abnormality.
Soft tissues are unremarkable.
IMPRESSION: Negative.

## 2020-01-04 DIAGNOSIS — N529 Male erectile dysfunction, unspecified: Secondary | ICD-10-CM | POA: Insufficient documentation

## 2020-12-29 DIAGNOSIS — F4310 Post-traumatic stress disorder, unspecified: Secondary | ICD-10-CM | POA: Insufficient documentation

## 2021-06-01 ENCOUNTER — Other Ambulatory Visit: Payer: Self-pay

## 2021-06-01 ENCOUNTER — Emergency Department
Admission: EM | Admit: 2021-06-01 | Discharge: 2021-06-01 | Disposition: A | Discharge status: Home / Self Care | Payer: Medicaid Other | Attending: Emergency Medicine | Admitting: Emergency Medicine

## 2021-06-01 DIAGNOSIS — E1165 Type 2 diabetes mellitus with hyperglycemia: Secondary | ICD-10-CM | POA: Diagnosis not present

## 2021-06-01 DIAGNOSIS — J45909 Unspecified asthma, uncomplicated: Secondary | ICD-10-CM | POA: Diagnosis not present

## 2021-06-01 DIAGNOSIS — I10 Essential (primary) hypertension: Secondary | ICD-10-CM | POA: Diagnosis not present

## 2021-06-01 DIAGNOSIS — F17210 Nicotine dependence, cigarettes, uncomplicated: Secondary | ICD-10-CM | POA: Diagnosis not present

## 2021-06-01 DIAGNOSIS — R42 Dizziness and giddiness: Secondary | ICD-10-CM | POA: Diagnosis present

## 2021-06-01 DIAGNOSIS — Z7984 Long term (current) use of oral hypoglycemic drugs: Secondary | ICD-10-CM | POA: Insufficient documentation

## 2021-06-01 DIAGNOSIS — R739 Hyperglycemia, unspecified: Secondary | ICD-10-CM

## 2021-06-01 DIAGNOSIS — Z79899 Other long term (current) drug therapy: Secondary | ICD-10-CM | POA: Insufficient documentation

## 2021-06-01 LAB — URINALYSIS, COMPLETE (UACMP) WITH MICROSCOPIC
Bacteria, UA: NONE SEEN
Bilirubin Urine: NEGATIVE
Glucose, UA: >=500 mg/dL — AB
Hgb urine dipstick: NEGATIVE
Ketones, ur: NEGATIVE mg/dL
Leukocytes,Ua: NEGATIVE
Nitrite: NEGATIVE
Protein, ur: NEGATIVE mg/dL
Specific Gravity, Urine: 1.029 (ref 1.005–1.030)
Squamous Epithelial / HPF: NONE SEEN (ref 0–5)
WBC, UA: NONE SEEN WBC/hpf (ref 0–5)
pH: 6 (ref 5.0–8.0)

## 2021-06-01 LAB — CBC
HCT: 40.8 % (ref 39.0–52.0)
Hemoglobin: 14.3 g/dL (ref 13.0–17.0)
MCH: 31.3 pg (ref 26.0–34.0)
MCHC: 35 g/dL (ref 30.0–36.0)
MCV: 89.3 fL (ref 80.0–100.0)
Platelets: 322 10*3/uL (ref 150–400)
RBC: 4.57 MIL/uL (ref 4.22–5.81)
RDW: 12.3 % (ref 11.5–15.5)
WBC: 13.5 10*3/uL — ABNORMAL HIGH (ref 4.0–10.5)
nRBC: 0 % (ref 0.0–0.2)

## 2021-06-01 LAB — COMPREHENSIVE METABOLIC PANEL
ALT: 11 U/L (ref 0–44)
AST: 12 U/L — ABNORMAL LOW (ref 15–41)
Albumin: 4.1 g/dL (ref 3.5–5.0)
Alkaline Phosphatase: 89 U/L (ref 38–126)
Anion gap: 5 (ref 5–15)
BUN: 16 mg/dL (ref 6–20)
CO2: 27 mmol/L (ref 22–32)
Calcium: 9.2 mg/dL (ref 8.9–10.3)
Chloride: 95 mmol/L — ABNORMAL LOW (ref 98–111)
Creatinine, Ser: 1.11 mg/dL (ref 0.61–1.24)
GFR, Estimated: >60 mL/min (ref >60)
Glucose, Bld: 480 mg/dL — ABNORMAL HIGH (ref 70–99)
Potassium: 4.1 mmol/L (ref 3.5–5.1)
Sodium: 127 mmol/L — ABNORMAL LOW (ref 135–145)
Total Bilirubin: 0.9 mg/dL (ref 0.3–1.2)
Total Protein: 7.7 g/dL (ref 6.5–8.1)

## 2021-06-01 LAB — TROPONIN I (HIGH SENSITIVITY): Troponin I (High Sensitivity): 4 ng/L (ref <18)

## 2021-06-01 MED ORDER — INSULIN ASPART 100 UNIT/ML IJ SOLN
10.0000 [IU] | Freq: Once | INTRAMUSCULAR | Status: AC
  Administered 2021-06-01: 14:00:00 10 [IU] via SUBCUTANEOUS
  Filled 2021-06-01: qty 1

## 2021-06-01 NOTE — ED Triage Notes (Signed)
Pt states he bent over and upon standing he became dizzy. States checked his bp and it was 98/79. Pt denies any loc, or hx of the same. Denies any recent illness or cold symptoms. Denies any pain or dizziness at this time.

## 2021-06-01 NOTE — Discharge Instructions (Signed)
Your blood sugar was elevated to almost 500.  This may be why you felt lightheaded today.  Please follow-up with your primary care provider as you likely need to be started on medication for your blood sugar.

## 2021-06-01 NOTE — ED Provider Notes (Signed)
[facility]  ____________________________________________   Event Date/Time   First MD Initiated Contact with Patient 06/01/21 1329     (approximate)  I have reviewed the triage vital signs and the nursing notes.   HISTORY  Chief Complaint Dizziness    HPI Karl Weaver. is a 60 y.o. male with past medical history of diabetes, hypertension, asthma who presents with dizziness.  Patient was in his normal state of health when he bent down to pick up a box and when he stood back up he felt dizzy.  This only lasted for several seconds.  He checked his blood pressure and it was about 99 systolic and then checked his blood sugar and it was read as high.  Patient is not on any medication for his diabetes.  Previously had tried metformin but did not like it due to side effect of sexual dysfunction.  Says he had also been on insulin but decided he did not want to take it.  He is currently taking cinnamon for the diabetes only.  He denies any ongoing dizziness or lightheadedness.  Denies numbness tingling weakness.  Denies chest pain shortness of breath abdominal pain nausea vomiting.  Has had some increased thirst but no increased urinary frequency.         Past Medical History:  Diagnosis Date   Arthritis    Asthma    Degenerative arthritis    Depression    Diabetes mellitus without complication (HCC)    Hypertension     There are no problems to display for this patient.   Past Surgical History:  Procedure Laterality Date   BACK SURGERY     HAND SURGERY     KNEE SURGERY     SHOULDER SURGERY      Prior to Admission medications   Medication Sig Start Date End Date Taking? Authorizing Provider  beclomethasone (QVAR) 40 MCG/ACT inhaler Inhale 2 puffs into the lungs 2 (two) times daily.    [provider]  benzonatate (TESSALON) 100 MG capsule Take 1 capsule (100 mg total) by mouth every 8 (eight) hours. 04/05/19   Verlee Monte, NP   glimepiride (AMARYL) 1 MG tablet Take 1 mg by mouth daily with breakfast.    [provider]  lisinopril (PRINIVIL,ZESTRIL) 10 MG tablet Take 1 tablet (10 mg total) by mouth daily. Patient taking differently: Take 40 mg by mouth daily.  02/05/17   Payton Mccallum, MD  metFORMIN (GLUCOPHAGE) 1000 MG tablet Take 1,000 mg by mouth 2 (two) times daily with a meal.    [provider]  pantoprazole (PROTONIX) 40 MG tablet Take 40 mg by mouth daily.    [provider]  DULoxetine (CYMBALTA) 30 MG capsule Take 30 mg by mouth 2 (two) times daily.  02/08/19  [provider]    Allergies Codeine, Cyclobenzaprine, Diclofenac sodium, Esomeprazole magnesium, Nsaids, and Amitriptyline  Family History  Problem Relation Age of Onset   Asthma Mother    Other Mother        brain tumor   Brain cancer Mother    Lung cancer Father    Liver cancer Father    Hypertension Father     Social History Social History   Tobacco Use   Smoking status: Every Day    Packs/day: 0.50    Years: 30.00    Pack years: 15.00    Types: Cigarettes   Smokeless tobacco: Never  Vaping Use   Vaping Use: Former  Substance Use Topics   Alcohol use: No   Drug use: No    Review of Systems   Review of Systems  Constitutional:  Negative for appetite change, chills and fever.  Respiratory:  Negative for cough, chest tightness and shortness of breath.   Cardiovascular:  Negative for chest pain.  Gastrointestinal:  Negative for abdominal pain, nausea and vomiting.  Genitourinary:  Negative for dysuria.  Neurological:  Positive for dizziness.  All other systems reviewed and are negative.  Physical Exam Updated Vital Signs BP 127/81 (BP Location: Right Arm)    Pulse 90    Temp 97.8 F (36.6 C) (Oral)    Resp 18    Ht 6\' 1"  (1.854 m)    Wt 88 kg    SpO2 98%    BMI 25.60 kg/m   Physical Exam Vitals and nursing note reviewed.  Constitutional:      General: He is not in acute  distress.    Appearance: Normal appearance.  HENT:     Head: Normocephalic and atraumatic.  Eyes:     General: No scleral icterus.    Conjunctiva/sclera: Conjunctivae normal.  Pulmonary:     Effort: Pulmonary effort is normal. No respiratory distress.     Breath sounds: Normal breath sounds. No wheezing.  Abdominal:     General: Abdomen is flat. There is no distension.     Palpations: Abdomen is soft.     Tenderness: There is no abdominal tenderness. There is no guarding.  Musculoskeletal:        General: No deformity or signs of injury.     Cervical back: Normal range of motion.  Skin:    Coloration: Skin is not jaundiced or pale.  Neurological:     General: No focal deficit present.     Mental Status: He is alert and oriented to person, place, and time. Mental status is at baseline.  Psychiatric:        Mood and Affect: Mood normal.        Behavior: Behavior normal.     LABS (all labs ordered are listed, but only abnormal results are displayed)  Labs Reviewed  CBC - Abnormal; Notable for the following components:      Result Value   WBC 13.5 (*)    All other components within normal limits  COMPREHENSIVE METABOLIC PANEL - Abnormal; Notable for the following components:   Sodium 127 (*)    Chloride 95 (*)    Glucose, Bld 480 (*)    AST 12 (*)    All other components within normal limits  URINALYSIS, COMPLETE (UACMP) WITH MICROSCOPIC - Abnormal; Notable for the following components:   Color, Urine YELLOW (*)    APPearance HAZY (*)    Glucose, UA >=500 (*)    All other components within normal limits  TROPONIN I (HIGH SENSITIVITY)   ____________________________________________  EKG  Normal axis, normal intervals, Q-waves in V2 no acute ischemic changes ____________________________________________  RADIOLOGY , personally viewed and evaluated these images (plain radiographs) as part of my medical decision making, as well as reviewing the  written report by the radiologist.  ED MD interpretation:      ____________________________________________   PROCEDURES  Procedure(s) performed (including Critical Care):  Procedures   ____________________________________________   INITIAL IMPRESSION / ASSESSMENT AND PLAN / ED COURSE     Patient is a 60 year old male with history of type 2 diabetes not on any medication who presents with an episode of  dizziness that is now resolved.  Occurred after he had bent down to pick up a box.  Currently is asymptomatic.  No fevers chills or GI symptoms.  Denies chest pain or dyspnea.  Vital signs within normal limits.  He is notably hyperglycemic with a blood sugar of 488.  Patient does not take meds for diabetes, has been prescribed metformin and insulin the past but has decided to manage it himself with cinnamon supplements.  Patient is not willing to be started on metformin due to prior side effects.  Discussed with him that he should follow-up with his primary care provider as he will likely need to be started on an antihyperglycemic agent.  Labs do not show any evidence of DKA are otherwise reassuring.  Patient was given subq insulin in ED.  He is otherwise stable for discharge.      ____________________________________________   FINAL CLINICAL IMPRESSION(S) / ED DIAGNOSES  Final diagnoses:  Hyperglycemia     ED Discharge Orders     None        Note:  This document was prepared using Dragon voice recognition software and may include unintentional dictation errors.    Georga Hacking, MD 06/01/21 [phone]

## 2021-06-01 NOTE — ED Provider Notes (Signed)
Emergency Medicine Provider Triage Evaluation Note  Karl Weaver Karl Weaver. , a 60 y.o. male  was evaluated in triage.  Pt complains of dizziness this morning.  Elevated glucose of 500+.  Review of Systems  Positive: Dizziness, elevated glucose Negative: Chest pain, shortness of breath  Physical Exam  BP 107/76 (BP Location: Left Arm)    Pulse 100    Temp 97.8 F (36.6 C) (Oral)    Resp 20    Ht 6\' 1"  (1.854 m)    Wt 88 kg    SpO2 100%    BMI 25.60 kg/m  Gen:   Awake, no distress   Resp:  Normal effort  MSK:   Moves extremities without difficulty  Other:    Medical Decision Making  Medically screening exam initiated at 12:01 PM.  Appropriate orders placed.  . was informed that the remainder of the evaluation will be completed by another provider, this initial triage assessment does not replace that evaluation, and the importance of remaining in the ED until their evaluation is complete.  Patient is a type II diabetic.  Dizziness only lasted for a few seconds.  Did check his glucose and it was over 500.  His blood pressure was a little low at this time at 98/79.  Patient had 2 cups of coffee and a drink water this morning   [name], PA-C 06/01/21 1201    06/03/21, MD 06/01/21 [phone]

## 2021-11-01 ENCOUNTER — Emergency Department
Admission: EM | Admit: 2021-11-01 | Discharge: 2021-11-01 | Disposition: A | Discharge status: Home / Self Care | Payer: Medicaid Other | Attending: Emergency Medicine | Admitting: Emergency Medicine

## 2021-11-01 ENCOUNTER — Emergency Department: Payer: Medicaid Other

## 2021-11-01 ENCOUNTER — Other Ambulatory Visit: Payer: Self-pay

## 2021-11-01 DIAGNOSIS — G8929 Other chronic pain: Secondary | ICD-10-CM | POA: Insufficient documentation

## 2021-11-01 DIAGNOSIS — Z8616 Personal history of COVID-19: Secondary | ICD-10-CM | POA: Insufficient documentation

## 2021-11-01 DIAGNOSIS — M25562 Pain in left knee: Secondary | ICD-10-CM | POA: Insufficient documentation

## 2021-11-01 DIAGNOSIS — Z96651 Presence of right artificial knee joint: Secondary | ICD-10-CM | POA: Insufficient documentation

## 2021-11-01 MED ORDER — LIDOCAINE 5 % EX PTCH
1.0000 | MEDICATED_PATCH | CUTANEOUS | Status: DC
Start: 2021-11-01 — End: 2021-11-01
  Administered 2021-11-01: 1 via TRANSDERMAL
  Filled 2021-11-01: qty 1

## 2021-11-01 MED ORDER — OXYCODONE HCL 5 MG PO TABS
5.0000 mg | ORAL_TABLET | Freq: Once | ORAL | Status: AC
  Administered 2021-11-01: 5 mg via ORAL
  Filled 2021-11-01: qty 1

## 2021-11-01 MED ORDER — ACETAMINOPHEN 325 MG PO TABS
650.0000 mg | ORAL_TABLET | Freq: Once | ORAL | Status: AC
  Administered 2021-11-01: 650 mg via ORAL
  Filled 2021-11-01: qty 2

## 2021-11-01 NOTE — ED Provider Notes (Signed)
[facility] Provider Note    Event Date/Time   First MD Initiated Contact with Patient 11/01/21 1359     (approximate)   History   Knee Pain   HPI  Karl MediaGeorge Patrick Exantus Jr. is a 61 y.o. 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  Patient reports that he has had this pain since February.  He reports that after he had COVID-19 he feels like all of his joints became painful.  Most of his joints have improved with the exception of his left knee.  He describes his pain as a grinding sensation anytime he attempts to weight-bear.  He reports that this is exactly how his right knee felt prior to his knee replacement surgery.  He has plans to get a left knee replacement, but was told by his orthopedist that he needs to bring his A1c and blood pressure down first.  He is actively working on this.  He denies any fevers or chills.  He has not noticed any swelling or redness to his knee.  His pain is not different today than it usually is.  He is still able to ambulate.  He has been taking Tylenol and ibuprofen at home without significant improvement of his symptoms.  No falls or trauma.       Physical Exam   Triage Vital Signs: ED Triage Vitals  Enc Vitals Group     BP 11/01/21 1351 (!) 146/91     Pulse Rate 11/01/21 1351 (!) 102     Resp 11/01/21 1351 18     Temp 11/01/21 1351 98.1 F (36.7 C)     Temp Source 11/01/21 1351 Oral     SpO2 11/01/21 1351 97 %     Weight 11/01/21 1350 171 lb (77.6 kg)     Height 11/01/21 1350 6' (1.829 m)     Head Circumference --      Peak Flow --      Pain Score 11/01/21 1350 9     Pain Loc --      Pain Edu? --      Excl. in GC? --     Most recent vital signs: Vitals:   11/01/21 1351  BP: (!) 146/91  Pulse: (!) 102  Resp: 18  Temp: 98.1 F (36.7 C)  SpO2: 97%    Physical Exam Vitals and nursing  note reviewed.  Constitutional:      General: Awake and alert. No acute distress.    Appearance: Normal appearance. He is well-developed and normal weight.  Laying comfortably in the bed watching TV HENT:     Head: Normocephalic and atraumatic.     Mouth/Throat:     Mouth: Mucous membranes are moist.  Eyes:     General: PERRL. Normal EOMs        Right eye: No discharge.        Left eye: No discharge.     Conjunctiva/sclera: Conjunctivae normal.  Cardiovascular:     Rate and Rhythm: Normal rate and regular rhythm.     Pulses: Normal pulses.     Heart sounds: Normal heart sounds Pulmonary:     Effort: Pulmonary effort is normal. No respiratory distress.     Breath sounds: Normal breath sounds.  Abdominal:     Abdomen is soft. There is no abdominal tenderness. No rebound or guarding. No distention. Musculoskeletal:  General: No swelling. Normal range of motion.     Cervical back: Normal range of motion and neck supple.  Right knee: Large vertical midline surgical incision that appears to be well-healed Left knee: No deformity or rash.  Diffuse joint line tenderness.  Mild patellar tenderness, no ballotment Warm and well perfused extremity with 2+ pedal pulses 5/5 strength to dorsiflexion and plantarflexion at the ankle with intact sensation throughout extremity Normal range of motion of the knee, with intact flexion and extension to active and passive range of motion no pain with range of motion. Extensor mechanism intact. No ligamentous laxity. Negative anterior/posterior drawer/negative lachman, negative mcmurrays No effusion or warmth or erythema Intact quadriceps, hamstring function, patellar tendon function Pelvis stable Full ROM of ankle without pain or swelling Foot warm and well perfused Skin:    General: Skin is warm and dry.     Capillary Refill: Capillary refill takes less than 2 seconds.     Findings: No rash.  Neurological:     Mental Status: He is alert.       ED Results / Procedures / Treatments   Labs (all labs ordered are listed, but only abnormal results are displayed) Labs Reviewed - No data to display   EKG     RADIOLOGY I independently reviewed the patient's x-ray and agree with radiologist's findings    PROCEDURES:  Critical Care performed:   Procedures   MEDICATIONS ORDERED IN ED: Medications  lidocaine (LIDODERM) 5 % 1 patch (1 patch Transdermal Patch Applied 11/01/21 1449)  oxyCODONE (Oxy IR/ROXICODONE) immediate release tablet 5 mg (5 mg Oral Given 11/01/21 1448)  acetaminophen (TYLENOL) tablet 650 mg (650 mg Oral Given 11/01/21 1447)     IMPRESSION / MDM / ASSESSMENT AND PLAN / ED COURSE  I reviewed the triage vital signs and the nursing notes.  Differential diagnosis includes, but is not limited to, arthritis, effusion, patellofemoral syndrome.  No warmth, erythema, fever/chills or joint effusion, and patient is able to actively and passively range his knee, do not suspect septic joint.  Patient reports that his pain today feels exactly the same as his pain for the past several months. No evidence of neurological deficit or vascular compromise on exam.  No deformity or obvious ligamentous laxity on exam.  No pitting edema or unilateral leg swelling to suggest DVT.  No tenderness along the calf.  No fracture/dislocation on X-Ray though he does have bony spurs consistent with osteoarthritis.  Patient has plans for left total knee replacement, however he was informed by his orthopedist that his A1c and blood pressure need to improve, and patient is actively working on this.  No indication for diagnostic or therapeutic procedure such as arthrocentesis. Return precautions and care instructions discussed.  Patient was treated symptomatically with improvement of his symptoms.  He is able to ambulate independently.  He was advised to follow-up with his orthopedic surgeon for further management.  We also discussed return  precautions.  Patient agrees with plan of care.  He was discharged in the care of his ex-wife who is driving him home.      FINAL CLINICAL IMPRESSION(S) / ED DIAGNOSES   Final diagnoses:  Chronic pain of left knee     Rx / DC Orders   ED Discharge Orders     None        Note:  This document was prepared using Dragon voice recognition software and may include unintentional dictation errors.   Jackelyn Hoehn, PA-C 11/01/21 1556  Merwyn Katos, MD 11/03/21 [phone]

## 2021-11-01 NOTE — Discharge Instructions (Signed)
Please follow up with orthopedics. Please return for new, worsening, or change in symptoms or other concerns.

## 2021-11-01 NOTE — ED Triage Notes (Signed)
Pt in via EMS from home with c/o left knee pain. Pt has hx of the same and has been getting PT for it. Pt's MD will not give him medication for pain and the OTC meds are not working so he is here for pain control.

## 2021-11-01 NOTE — ED Triage Notes (Signed)
Pt states he is having L knee pain since Feb- pt states it started after having covid- pt denies any falls or recent injury

## 2022-01-04 ENCOUNTER — Other Ambulatory Visit: Payer: Self-pay

## 2022-01-04 ENCOUNTER — Emergency Department
Admission: EM | Admit: 2022-01-04 | Discharge: 2022-01-04 | Disposition: A | Discharge status: Home / Self Care | Payer: Medicaid Other | Attending: Emergency Medicine | Admitting: Emergency Medicine

## 2022-01-04 ENCOUNTER — Emergency Department: Payer: Medicaid Other

## 2022-01-04 DIAGNOSIS — M25562 Pain in left knee: Secondary | ICD-10-CM

## 2022-01-04 DIAGNOSIS — R000 Tachycardia, unspecified: Secondary | ICD-10-CM | POA: Insufficient documentation

## 2022-01-04 DIAGNOSIS — R739 Hyperglycemia, unspecified: Secondary | ICD-10-CM | POA: Diagnosis not present

## 2022-01-04 DIAGNOSIS — E139 Other specified diabetes mellitus without complications: Secondary | ICD-10-CM

## 2022-01-04 DIAGNOSIS — M545 Low back pain, unspecified: Secondary | ICD-10-CM | POA: Diagnosis not present

## 2022-01-04 DIAGNOSIS — W19XXXA Unspecified fall, initial encounter: Secondary | ICD-10-CM

## 2022-01-04 DIAGNOSIS — W01198A Fall on same level from slipping, tripping and stumbling with subsequent striking against other object, initial encounter: Secondary | ICD-10-CM | POA: Insufficient documentation

## 2022-01-04 LAB — BASIC METABOLIC PANEL
Anion gap: 7 (ref 5–15)
BUN: 14 mg/dL (ref 6–20)
CO2: 26 mmol/L (ref 22–32)
Calcium: 9.5 mg/dL (ref 8.9–10.3)
Chloride: 99 mmol/L (ref 98–111)
Creatinine, Ser: 0.9 mg/dL (ref 0.61–1.24)
GFR, Estimated: >60 mL/min (ref >60)
Glucose, Bld: 351 mg/dL — ABNORMAL HIGH (ref 70–99)
Potassium: 4.2 mmol/L (ref 3.5–5.1)
Sodium: 132 mmol/L — ABNORMAL LOW (ref 135–145)

## 2022-01-04 LAB — CBC WITH DIFFERENTIAL/PLATELET
Abs Immature Granulocytes: 0.05 10*3/uL (ref 0.00–0.07)
Basophils Absolute: 0.1 10*3/uL (ref 0.0–0.1)
Basophils Relative: 1 %
Eosinophils Absolute: 0.1 10*3/uL (ref 0.0–0.5)
Eosinophils Relative: 1 %
HCT: 37.7 % — ABNORMAL LOW (ref 39.0–52.0)
Hemoglobin: 13.2 g/dL (ref 13.0–17.0)
Immature Granulocytes: 1 %
Lymphocytes Relative: 21 %
Lymphs Abs: 2.2 10*3/uL (ref 0.7–4.0)
MCH: 30.7 pg (ref 26.0–34.0)
MCHC: 35 g/dL (ref 30.0–36.0)
MCV: 87.7 fL (ref 80.0–100.0)
Monocytes Absolute: 0.8 10*3/uL (ref 0.1–1.0)
Monocytes Relative: 7 %
Neutro Abs: 7.6 10*3/uL (ref 1.7–7.7)
Neutrophils Relative %: 69 %
Platelets: 335 10*3/uL (ref 150–400)
RBC: 4.3 MIL/uL (ref 4.22–5.81)
RDW: 11.9 % (ref 11.5–15.5)
WBC: 10.8 10*3/uL — ABNORMAL HIGH (ref 4.0–10.5)
nRBC: 0 % (ref 0.0–0.2)

## 2022-01-04 LAB — CBG MONITORING, ED: Glucose-Capillary: 306 mg/dL — ABNORMAL HIGH (ref 70–99)

## 2022-01-04 MED ORDER — OXYCODONE HCL 5 MG PO TABS
5.0000 mg | ORAL_TABLET | Freq: Once | ORAL | Status: AC
  Administered 2022-01-04: 5 mg via ORAL
  Filled 2022-01-04: qty 1

## 2022-01-04 MED ORDER — IBUPROFEN 600 MG PO TABS: 600.0000 mg | ORAL_TABLET | Freq: Four times a day (QID) | ORAL | 0 refills | Status: AC | PRN

## 2022-01-04 MED ORDER — SODIUM CHLORIDE 0.9 % IV BOLUS
1000.0000 mL | Freq: Once | INTRAVENOUS | Status: AC
  Administered 2022-01-04: 1000 mL via INTRAVENOUS

## 2022-01-04 MED ORDER — ACETAMINOPHEN 500 MG PO TABS
1000.0000 mg | ORAL_TABLET | Freq: Once | ORAL | Status: AC
Start: 2022-01-04 — End: 2022-01-04
  Administered 2022-01-04: 1000 mg via ORAL
  Filled 2022-01-04: qty 2

## 2022-01-04 NOTE — ED Provider Triage Note (Signed)
Emergency Medicine Provider Triage Evaluation Note  Gianlucca Szymborski [name]. , a 61 y.o. male  was evaluated in triage.  Pt complains of left knee and low back pain.  Patient states that he was getting out of the shower this morning when his left knee gave out.  This caused him to fall backwards injuring his back.  Patient has history of back surgery.  He denies any loss of consciousness or head injury.  He was able to crawl to the telephone to call EMS himself.  Review of Systems  Positive: Nausea, low back pain. Negative: No vomiting, dizziness, headache, vision changes.  Physical Exam  There were no vitals taken for this visit. Gen:   Awake, no distress   Resp:  Normal effort  MSK:   Moves extremities without difficulty  Other:    Medical Decision Making  Medically screening exam initiated at 10:29 AM.  Appropriate orders placed.  [name]. was informed that the remainder of the evaluation will be completed by another provider, this initial triage assessment does not replace that evaluation, and the importance of remaining in the ED until their evaluation is complete.     Tommi Rumps, PA-C 01/04/22 [phone]

## 2022-01-04 NOTE — Discharge Instructions (Addendum)
It is important that you take your insulin.  You can use Tylenol 1 g every 8 hours and alternate with the ibuprofen with food.  Please call the orthopedic number to get a follow-up for your x-rays as below.  We are giving you a hinged knee brace to help support your knee until he can get follow-up  Return for fevers, worsening pain or any other concerns.   IMPRESSION: Prior L3-L5 anterior and posterior lumbar fusion with unchanged fractured right-sided L5 pedicle screw. No evidence of loosening or new complication.  No evidence of acute lumbar spine fracture. Moderate degenerative disc height loss at L2-L3 and L5-S1.

## 2022-01-04 NOTE — ED Provider Notes (Signed)
[facility] Provider Note    Event Date/Time   First MD Initiated Contact with Patient 01/04/22 1117     (approximate)   History   Fall, Knee Pain, and Back Pain   HPI  Karl Speckman. is a 61 y.o. male  who comes in with left knee and low back pain.  Patient reports that he was getting out of the shower when his left knee gave out and he fell down onto the ground.  He mostly fell onto his buttock but reports a little bit of low back pain and left knee pain.  He reports most of his pain in his left knee.  He reports having some chronic pain in this knee and following up with some outpatient doctors but has not been able to get any additional pain management of it.  He reports having a surgery on his back in 2005 and get off pain meds in a month.  He is not currently on any pain medication.  Denies taking anything to try to help with the pain.  He reports only being on the ground for 20 minutes.  He states that he did not hit his head did not lose consciousness.  Denies any chest pain, shortness of breath.  He does report that his sugars have been running high into the 400s and he reports being compliant with his insulin.   Physical Exam   Triage Vital Signs: ED Triage Vitals  Enc Vitals Group     BP 01/04/22 1030 (!) 136/99     Pulse Rate 01/04/22 1030 (!) 108     Resp 01/04/22 1030 18     Temp 01/04/22 1030 97.7 F (36.5 C)     Temp Source 01/04/22 1030 Oral     SpO2 01/04/22 1030 99 %     Weight 01/04/22 1031 162 lb (73.5 kg)     Height 01/04/22 1031 6' (1.829 m)     Head Circumference --      Peak Flow --      Pain Score 01/04/22 1031 10     Pain Loc --      Pain Edu? --      Excl. in GC? --     Most recent vital signs: Vitals:   01/04/22 1030  BP: (!) 136/99  Pulse: (!) 108  Resp: 18  Temp: 97.7 F (36.5 C)  SpO2: 99%     General: Awake, no distress.  CV:  Good peripheral perfusion.  Resp:  Normal effort.  Abd:  No  distention.  Other:  Patient has some tenderness on his left knee but able to pick the leg up off the bed good distal pulse.  No warmth or redness noted.  He is got no CTL spine tenderness.  Full range of motion of neck without any numbness or tingling.   ED Results / Procedures / Treatments   Labs (all labs ordered are listed, but only abnormal results are displayed) Labs Reviewed  CBC WITH DIFFERENTIAL/PLATELET  BASIC METABOLIC PANEL     EKG  My interpretation of EKG:  EKG is normal sinus rate of 92 without any ST elevation, T wave inversion lead III, type I AV block  RADIOLOGY I have reviewed the xray personally and interpreted no evidence of any fracture  IMPRESSION: Prior L3-L5 anterior and posterior lumbar fusion with unchanged fractured right-sided L5 pedicle screw. No evidence of loosening or new complication.  No evidence of acute lumbar spine fracture.  Moderate degenerative disc height loss at L2-L3 and L5-S1.    IMPRESSION: No evidence of acute fracture.  Mild tricompartment osteoarthritis.     PROCEDURES:  Critical Care performed: No  Procedures   MEDICATIONS ORDERED IN ED: Medications  sodium chloride 0.9 % bolus 1,000 mL (has no administration in time range)  oxyCODONE (Oxy IR/ROXICODONE) immediate release tablet 5 mg (has no administration in time range)  acetaminophen (TYLENOL) tablet 1,000 mg (has no administration in time range)     IMPRESSION / MDM / ASSESSMENT AND PLAN / ED COURSE  I reviewed the triage vital signs and the nursing notes.   Patient's presentation is most consistent with acute presentation with potential threat to life or bodily function.   Differential is fracture, dislocation.  Will get x-ray to further evaluate of the knee.  We will get an x-ray of the lumbar area but no significant pain at this time I have lower suspicion for acute fracture therefore we will hold off on CT imaging.  He denies any head trauma and  denies any cervical pain to suggest other injuries.  No chest wall pain, abdominal pain.  He does have some tachycardia reports that sugars have been in the 400s so we will get labs to evaluate for DKA and give a liter of fluid and some pain medicine.  Labs show elevated glucose of 351.  Patient getting some IV fluid for the tachycardia.  No evidence of DKA given normal anion gap.  CBC shows slightly elevated white count but he denies any other infectious symptoms and is afebrile and no left shift noted.  X-ray shows some osteoarthritis in the left knee and some stable fractured screw in his back but no new acute spinal fractures and my suspicion was very low therefore do not feel we need to do CT imaging.  Reevaluated patient is feeling better.  Will get patient a hinged knee brace and have him follow-up with orthopedics.  Patient reports has been noncompliant with his insulin we discussed the importance of taking his insulin therefore will not make any changes to it.  He is going to follow-up with his primary care doctor.  Patient's been able to tolerate ambulating and is comfortable with discharge home  Ibuprofen is listed as an allergy but patient reports not having an allergy to it and okay with proceeding with the ibuprofen.  Discussed eating food with it to help prevent GI upset.  Considered admission but given evidence of DKA patient is safe for discharge  FINAL CLINICAL IMPRESSION(S) / ED DIAGNOSES   Final diagnoses:  Fall, initial encounter  Acute pain of left knee     Rx / DC Orders   ED Discharge Orders          Ordered    ibuprofen (ADVIL) 600 MG tablet  Every 6 hours PRN        01/04/22 1306             Note:  This document was prepared using Dragon voice recognition software and may include unintentional dictation errors.   Concha Se, MD 01/04/22 [phone]

## 2022-01-04 NOTE — ED Triage Notes (Signed)
Pt, from home, c/o L knee pain and low back pain after a fall this morning.  Pain score 10/10.  Pt reports L knee gave out and caused fall.  Hx of back surgery and chronic L knee pain.

## 2022-01-04 NOTE — ED Notes (Signed)
Hinged knee brace applied to left leg.  Size large.

## 2022-01-04 NOTE — ED Notes (Signed)
Pt has L knee problems with "bone on bone" OA. Pt fell backwards onto back in shower. Pt in NAD.

## 2022-01-21 ENCOUNTER — Encounter: Payer: Self-pay | Admitting: Nurse Practitioner

## 2022-01-21 ENCOUNTER — Telehealth: Payer: Self-pay

## 2022-01-21 ENCOUNTER — Ambulatory Visit: Payer: Medicaid Other | Admitting: Nurse Practitioner

## 2022-01-21 VITALS — BP 138/78 | HR 104 | Temp 98.0°F | Ht 72.0 in | Wt 166.0 lb

## 2022-01-21 DIAGNOSIS — E785 Hyperlipidemia, unspecified: Secondary | ICD-10-CM

## 2022-01-21 DIAGNOSIS — Z794 Long term (current) use of insulin: Secondary | ICD-10-CM | POA: Diagnosis not present

## 2022-01-21 DIAGNOSIS — Z23 Encounter for immunization: Secondary | ICD-10-CM

## 2022-01-21 DIAGNOSIS — R634 Abnormal weight loss: Secondary | ICD-10-CM | POA: Insufficient documentation

## 2022-01-21 DIAGNOSIS — E1159 Type 2 diabetes mellitus with other circulatory complications: Secondary | ICD-10-CM

## 2022-01-21 DIAGNOSIS — E1165 Type 2 diabetes mellitus with hyperglycemia: Secondary | ICD-10-CM

## 2022-01-21 DIAGNOSIS — M25562 Pain in left knee: Secondary | ICD-10-CM

## 2022-01-21 DIAGNOSIS — Z1211 Encounter for screening for malignant neoplasm of colon: Secondary | ICD-10-CM

## 2022-01-21 DIAGNOSIS — Z125 Encounter for screening for malignant neoplasm of prostate: Secondary | ICD-10-CM

## 2022-01-21 DIAGNOSIS — I152 Hypertension secondary to endocrine disorders: Secondary | ICD-10-CM

## 2022-01-21 DIAGNOSIS — E119 Type 2 diabetes mellitus without complications: Secondary | ICD-10-CM

## 2022-01-21 DIAGNOSIS — Z9989 Dependence on other enabling machines and devices: Secondary | ICD-10-CM

## 2022-01-21 DIAGNOSIS — G8929 Other chronic pain: Secondary | ICD-10-CM

## 2022-01-21 DIAGNOSIS — G4733 Obstructive sleep apnea (adult) (pediatric): Secondary | ICD-10-CM

## 2022-01-21 LAB — COMPREHENSIVE METABOLIC PANEL
ALT: 10 U/L (ref 0–53)
AST: 8 U/L (ref 0–37)
Albumin: 4.1 g/dL (ref 3.5–5.2)
Alkaline Phosphatase: 119 U/L — ABNORMAL HIGH (ref 39–117)
BUN: 10 mg/dL (ref 6–23)
CO2: 28 mEq/L (ref 19–32)
Calcium: 9.7 mg/dL (ref 8.4–10.5)
Chloride: 93 mEq/L — ABNORMAL LOW (ref 96–112)
Creatinine, Ser: 0.93 mg/dL (ref 0.40–1.50)
GFR: 90.45 mL/min (ref >60.00)
Glucose, Bld: 595 mg/dL (ref 70–99)
Potassium: 4.1 mEq/L (ref 3.5–5.1)
Sodium: 129 mEq/L — ABNORMAL LOW (ref 135–145)
Total Bilirubin: 0.3 mg/dL (ref 0.2–1.2)
Total Protein: 7 g/dL (ref 6.0–8.3)

## 2022-01-21 LAB — CBC
HCT: 39.2 % (ref 39.0–52.0)
Hemoglobin: 13.3 g/dL (ref 13.0–17.0)
MCHC: 33.9 g/dL (ref 30.0–36.0)
MCV: 91.1 fl (ref 78.0–100.0)
Platelets: 419 10*3/uL — ABNORMAL HIGH (ref 150.0–400.0)
RBC: 4.31 Mil/uL (ref 4.22–5.81)
RDW: 12.5 % (ref 11.5–15.5)
WBC: 13.1 10*3/uL — ABNORMAL HIGH (ref 4.0–10.5)

## 2022-01-21 LAB — LIPID PANEL
Cholesterol: 155 mg/dL (ref 0–200)
HDL: 41.1 mg/dL (ref >39.00)
LDL Cholesterol: 78 mg/dL (ref 0–99)
NonHDL: 114.06
Total CHOL/HDL Ratio: 4
Triglycerides: 182 mg/dL — ABNORMAL HIGH (ref 0.0–149.0)
VLDL: 36.4 mg/dL (ref 0.0–40.0)

## 2022-01-21 LAB — PSA: PSA: 1.94 ng/mL (ref 0.10–4.00)

## 2022-01-21 LAB — HEMOGLOBIN A1C: Hgb A1c MFr Bld: 12 % — ABNORMAL HIGH (ref 4.6–6.5)

## 2022-01-21 LAB — TSH: TSH: 1.58 u[IU]/mL (ref 0.35–5.50)

## 2022-01-21 NOTE — Progress Notes (Signed)
New Patient Office Visit  Subjective    Patient ID: Karl Weaver., male    DOB: 04-08-1961  Age: 61 y.o. MRN: [id number]  CC:  Chief Complaint  Patient presents with   New Patient (Initial Visit)    HPI Karl Weaver. presents to establish care   DM2: 20 units of lantus. Checks sugar at home about once a week.  States that he used to be on approximately 30 units of Lantus and when he was his glucoses were between 109-131.  Patient states he does not check his blood sugar more often as he has cats that enjoy to play with a canister with the strips inside. High: 320 Low: months ago 109-131  HTN: Hx dx and has lost 115 since feburary. Not on medications. Was on BP medications in the past. Lisinopril 40mg .  Blood pressure within normal limits today  Asthma: Qvar 2 puffs 2 times a daily. States that he has a albuterol inhaler and uses it when it gets real humid outside.  States he has no trouble in the wintertime  OSA: Currenlty uses CPAP 1-2 hours. States that his knee is bothering him  HLD: Hx has lost weight. Not currently on cholesterol medication   Knee: Has seen orthopedics in the past.  In [place]. No injections for an extended period of time. 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 Left knee no surgery.  Had a MRI between 2011-2013 on the left knee.  Colonoscopy: needs [place]  TDAP: Update today Shingles: Information reviewed PSA: Due  Outpatient Encounter Medications as of 01/21/2022  Medication Sig   beclomethasone (QVAR) 40 MCG/ACT inhaler Inhale 2 puffs into the lungs 2 (two) times daily.   pantoprazole (PROTONIX) 40 MG tablet Take 40 mg by mouth daily.   LANTUS 100 UNIT/ML injection Inject 20 Units into the skin at bedtime.   [DISCONTINUED] benzonatate (TESSALON) 100 MG capsule Take 1 capsule (100 mg total) by mouth every 8 (eight) hours.   [DISCONTINUED] DULoxetine (CYMBALTA) 30 MG capsule Take 30 mg by mouth 2 (two) times  daily.   [DISCONTINUED] glimepiride (AMARYL) 1 MG tablet Take 1 mg by mouth daily with breakfast.   [DISCONTINUED] lisinopril (PRINIVIL,ZESTRIL) 10 MG tablet Take 1 tablet (10 mg total) by mouth daily. (Patient taking differently: Take 40 mg by mouth daily. )   [DISCONTINUED] metFORMIN (GLUCOPHAGE) 1000 MG tablet Take 1,000 mg by mouth 2 (two) times daily with a meal.   No facility-administered encounter medications on file as of 01/21/2022.    Past Medical History:  Diagnosis Date   Arthritis    Asthma    Degenerative arthritis    Depression    Diabetes mellitus without complication (HCC)    Hypertension     Past Surgical History:  Procedure Laterality Date   BACK SURGERY     HAND SURGERY     KNEE SURGERY     SHOULDER SURGERY      Family History  Problem Relation Age of Onset   Asthma Mother    Other Mother        brain tumor   Brain cancer Mother    Lung cancer Father    Liver cancer Father    Hypertension Father     Social History   Socioeconomic History   Marital status: Divorced    Spouse name: Not on file   Number of children: 2   Years of education: Not on file   Highest education  level: Not on file  Occupational History   Not on file  Tobacco Use   Smoking status: Every Day    Packs/day: 0.50    Years: 30.00    Total pack years: 15.00    Types: Cigarettes   Smokeless tobacco: Never  Vaping Use   Vaping Use: Former  Substance and Sexual Activity   Alcohol use: No   Drug use: No   Sexual activity: Not Currently  Other Topics Concern   Not on file  Social History Narrative   Disability    Social Determinants of Health   Financial Resource Strain: Not on file  Food Insecurity: Not on file  Transportation Needs: Not on file  Physical Activity: Not on file  Stress: Not on file  Social Connections: Not on file  Intimate Partner Violence: Not on file    Review of Systems  Constitutional:  Positive for malaise/fatigue. Negative for chills and  fever.  Respiratory:  Positive for shortness of breath (with humidity).   Cardiovascular:  Negative for chest pain and leg swelling.  Gastrointestinal:  Negative for abdominal pain, diarrhea, nausea and vomiting.       BM every other day  Genitourinary:  Negative for dysuria and hematuria.  Neurological:  Positive for dizziness. Negative for headaches.  Psychiatric/Behavioral:  Negative for hallucinations and suicidal ideas. The patient has insomnia.         Objective    BP 138/78 (BP Location: Left Arm, Patient Position: Sitting, Cuff Size: Normal)   Pulse (!) 104   Temp 98 F (36.7 C) (Temporal)   Ht 6' (1.829 m)   Wt 166 lb (75.3 kg)   SpO2 97%   BMI 22.51 kg/m   Physical Exam Vitals and nursing note reviewed.  Constitutional:      Appearance: Normal appearance.  HENT:     Right Ear: Tympanic membrane, ear canal and external ear normal.     Left Ear: Tympanic membrane, ear canal and external ear normal.     Mouth/Throat:     Mouth: Mucous membranes are moist.     Pharynx: Oropharynx is clear.  Eyes:     Extraocular Movements: Extraocular movements intact.     Pupils: Pupils are equal, round, and reactive to light.     Comments: pinpoint pupils in office   Cardiovascular:     Rate and Rhythm: Normal rate and regular rhythm.     Pulses: Normal pulses.     Heart sounds: Normal heart sounds.  Pulmonary:     Effort: Pulmonary effort is normal.     Breath sounds: Normal breath sounds.  Musculoskeletal:     Right knee: Normal pulse.     Left knee: Decreased range of motion. Tenderness present over the medial joint line. Normal pulse.  Lymphadenopathy:     Cervical: No cervical adenopathy.  Neurological:     General: No focal deficit present.     Mental Status: He is alert.     Deep Tendon Reflexes:     Reflex Scores:      Patellar reflexes are 1+ on the right side and 1+ on the left side.        Assessment & Plan:   Problem List Items Addressed This Visit        Cardiovascular and Mediastinum   Hypertension associated with diabetes (HCC)    Historical diagnosis.  Patient has lost a large amount of weight since the beginning of the year and no longer on blood pressure  medicine.  Used to be on lisinopril 40 mg per patient report.  Will continue letting him manage it via dietary modifications      Relevant Medications   LANTUS 100 UNIT/ML injection     Respiratory   OSA on CPAP    Patient is adherent to CPAP therapy.  States is not getting full nights of sleep because of pain due to his left knee.        Endocrine   Type 2 diabetes mellitus with hyperglycemia, with long-term current use of insulin (HCC) - Primary    Currently managed on 20 units of Lantus insulin daily.  Does not check his sugar every day.  States used to be on 30 units is better controlled.  Pending A1c in office prior to making an adjustment.  Patient does use vial and needle versus pens      Relevant Medications   LANTUS 100 UNIT/ML injection   Other Relevant Orders   CBC   Comprehensive metabolic panel   Hemoglobin A1c   Lipid panel     Other   Chronic pain of left knee    Patient having chronic left knee pain has been on several therapies per chart.  Would like to be evaluated by orthopedics.  Amatory referral to [facility] placed in the chart.      Relevant Orders   Ambulatory referral to Orthopedic Surgery   Hyperlipidemia    Historical diagnosis.  Patient was on medication in the past and had a great weight loss and not currently on medication at this time.  Pending labs      Unintentional weight loss   Relevant Orders   TSH   Other Visit Diagnoses     Screening for prostate cancer       Relevant Orders   PSA   Screening for colon cancer       Relevant Orders   Ambulatory referral to Gastroenterology   Need for Td vaccine           Return in about 3 months (around 04/23/2022) for DM recheck.   Audria Nine, NP

## 2022-01-21 NOTE — Assessment & Plan Note (Signed)
Historical diagnosis.  Patient has lost a large amount of weight since the beginning of the year and no longer on blood pressure medicine.  Used to be on lisinopril 40 mg per patient report.  Will continue letting him manage it via dietary modifications

## 2022-01-21 NOTE — Assessment & Plan Note (Signed)
Patient is adherent to CPAP therapy.  States is not getting full nights of sleep because of pain due to his left knee.

## 2022-01-21 NOTE — Assessment & Plan Note (Signed)
Historical diagnosis.  Patient was on medication in the past and had a great weight loss and not currently on medication at this time.  Pending labs

## 2022-01-21 NOTE — Telephone Encounter (Signed)
Hope from [place] lab called critical result Glucose 595 Critical results given to [facility].

## 2022-01-21 NOTE — Telephone Encounter (Signed)
noted 

## 2022-01-21 NOTE — Assessment & Plan Note (Signed)
Currently managed on 20 units of Lantus insulin daily.  Does not check his sugar every day.  States used to be on 30 units is better controlled.  Pending A1c in office prior to making an adjustment.  Patient does use vial and needle versus pens

## 2022-01-21 NOTE — Assessment & Plan Note (Signed)
Patient having chronic left knee pain has been on several therapies per chart.  Would like to be evaluated by orthopedics.  Amatory referral to [facility] placed in the chart.

## 2022-01-21 NOTE — Patient Instructions (Signed)
Nice to see me today I will be in touch with labs once I have them Follow up with me in 3 months, sooner if you need me

## 2022-01-25 ENCOUNTER — Telehealth: Payer: Self-pay

## 2022-01-25 ENCOUNTER — Other Ambulatory Visit: Payer: Self-pay

## 2022-01-25 ENCOUNTER — Other Ambulatory Visit: Payer: Self-pay | Admitting: Nurse Practitioner

## 2022-01-25 DIAGNOSIS — Z1211 Encounter for screening for malignant neoplasm of colon: Secondary | ICD-10-CM

## 2022-01-25 DIAGNOSIS — Z794 Long term (current) use of insulin: Secondary | ICD-10-CM

## 2022-01-25 MED ORDER — NA SULFATE-K SULFATE-MG SULF 17.5-3.13-1.6 GM/177ML PO SOLN: 1.0000 | Freq: Once | ORAL | 0 refills | Status: AC

## 2022-01-25 NOTE — Telephone Encounter (Signed)
I would like to send him in some metformin. I will pend the order as there is no pharmacy

## 2022-01-25 NOTE — Telephone Encounter (Signed)
Spoke with [facility] and [facility], update chart, patient states he does not want to take Metformin as it caused "sexual side effects that caused problems."

## 2022-01-25 NOTE — Telephone Encounter (Signed)
-----   Message from [facility], [place] sent at 01/22/2022  4:49 PM EDT ----- Patient advised. Patient states that his A1C before was 13.5 to 14. He is working on better diet and eating more veggies. He use to take Metformin, and Glimepiride-(but later was taking off due to interaction with other medications) Continued on Metformin  then when insulin was added he was taking off Metformin. Patient states he is willing to try another medication along with Lantus if that is needed.

## 2022-01-25 NOTE — Telephone Encounter (Signed)
Gastroenterology Pre-Procedure Review  Request Date: 03/08/22 Requesting Physician: Dr. Tobi Bastos  PATIENT REVIEW QUESTIONS: The patient responded to the following health history questions as indicated:    1. Are you having any GI issues? no 2. Do you have a personal history of Polyps? no 3. Do you have a family history of Colon Cancer or Polyps? no 4. Diabetes Mellitus? Yes diabetic 5. Joint replacements in the past 12 months?no 6. Major health problems in the past 3 months?no 7. Any artificial heart valves, MVP, or defibrillator?no    MEDICATIONS & ALLERGIES:    Patient reports the following regarding taking any anticoagulation/antiplatelet therapy:   Plavix, Coumadin, Eliquis, Xarelto, Lovenox, Pradaxa, Brilinta, or Effient? no Aspirin? no  Patient confirms/reports the following medications:  Current Outpatient Medications  Medication Sig Dispense Refill   beclomethasone (QVAR) 40 MCG/ACT inhaler Inhale 2 puffs into the lungs 2 (two) times daily.     LANTUS 100 UNIT/ML injection Inject 20 Units into the skin at bedtime.     pantoprazole (PROTONIX) 40 MG tablet Take 40 mg by mouth daily.     No current facility-administered medications for this visit.    Patient confirms/reports the following allergies:  Allergies  Allergen Reactions   Pork Allergy Anaphylaxis   Codeine Itching   Cyclobenzaprine Other (See Comments)    Other reaction(s): Other (See Comments) Increased blood sugar and irritability  Increased blood sugar and irritability     Diclofenac Sodium Other (See Comments)    Other reaction(s): Other (See Comments) Gi gas  Gi gas     Esomeprazole Magnesium Other (See Comments)    Other reaction(s): Other (See Comments) Increased acid  Increased acid     Nsaids Other (See Comments)    Increased blood sugar   Amitriptyline Rash    Other reaction(s): Other (See Comments) jittery jittery     No orders of the defined types were placed in this  encounter.   AUTHORIZATION INFORMATION Primary Insurance: 1D#: Group #:  Secondary Insurance: 1D#: Group #:  SCHEDULE INFORMATION: Date: 03/08/22 Time: Location: [facility]

## 2022-01-28 NOTE — Telephone Encounter (Signed)
Left detailed message for the patient with the information, need to know if he is ok with this medication. Ok per [employer] on file

## 2022-01-29 ENCOUNTER — Telehealth: Payer: Self-pay

## 2022-01-29 ENCOUNTER — Other Ambulatory Visit: Payer: Self-pay | Admitting: Nurse Practitioner

## 2022-01-29 DIAGNOSIS — E1165 Type 2 diabetes mellitus with hyperglycemia: Secondary | ICD-10-CM

## 2022-01-29 MED ORDER — FAMOTIDINE 20 MG PO TABS: 20.0000 mg | ORAL_TABLET | Freq: Every day | ORAL | 1 refills | Status: DC

## 2022-01-29 MED ORDER — ACCU-CHEK GUIDE VI STRP: ORAL_STRIP | 12 refills | Status: DC

## 2022-01-29 MED ORDER — GLIPIZIDE ER 5 MG PO TB24: 5.0000 mg | ORAL_TABLET | Freq: Every day | ORAL | 0 refills | Status: DC

## 2022-01-29 NOTE — Telephone Encounter (Signed)
Medication sent in. 

## 2022-01-29 NOTE — Addendum Note (Signed)
Addended by: Consuella Lose on: 01/29/2022 04:05 PM   Modules accepted: Orders

## 2022-01-29 NOTE — Telephone Encounter (Signed)
Patient asked for refill on Famotidine but in the chart we have Pantoprazole. Patient did not know which one and just said he took the last pill today. [facility] F[facility] for the patient in February last, no Pantoprazole on file with them.  Patient states he was taking Famotidine 1 at a time sparingly until he could get in with a provider and this medication does help. Pantoprazole is in the chart from 2017, patient states that may have been what he tried first.

## 2022-01-29 NOTE — Telephone Encounter (Signed)
Medicatoin sent in. Will not interfere but needs to Check glucose at least daily. Can we send in supplies for twice daily please     01/29/22  2:18 PM  Patient agreed as long as it does not interfere with his insulin. [facility]  January 28, 2022 01/28/22  9:31 AM Note Left detailed message for the patient with the information, need to know if he is ok with this medication. Ok per [facility] on file     January 27, 2022    01/27/22  5:18 PM  I am ok on trying him with an extended release glipizide medication to help with the sugars. If he is ok with that    Patient advised of all the information.

## 2022-03-08 ENCOUNTER — Ambulatory Visit: Payer: Medicaid Other

## 2022-03-08 ENCOUNTER — Telehealth: Payer: Self-pay

## 2022-03-08 ENCOUNTER — Other Ambulatory Visit: Payer: Self-pay

## 2022-03-08 ENCOUNTER — Ambulatory Visit
Admission: RE | Admit: 2022-03-08 | Discharge: 2022-03-08 | Disposition: A | Discharge status: Home / Self Care | Payer: Medicaid Other | Attending: Gastroenterology | Admitting: Gastroenterology

## 2022-03-08 ENCOUNTER — Encounter: Payer: Self-pay | Admitting: Gastroenterology

## 2022-03-08 ENCOUNTER — Encounter
Admission: RE | Disposition: A | Discharge status: Home / Self Care | Payer: Self-pay | Source: Home / Self Care | Attending: Gastroenterology

## 2022-03-08 DIAGNOSIS — Z538 Procedure and treatment not carried out for other reasons: Secondary | ICD-10-CM | POA: Diagnosis not present

## 2022-03-08 DIAGNOSIS — Z1211 Encounter for screening for malignant neoplasm of colon: Secondary | ICD-10-CM

## 2022-03-08 SURGERY — COLONOSCOPY WITH PROPOFOL
Anesthesia: General

## 2022-03-08 MED ORDER — SODIUM CHLORIDE 0.9 % IV SOLN: INTRAVENOUS | Status: DC

## 2022-03-08 MED ORDER — NA SULFATE-K SULFATE-MG SULF 17.5-3.13-1.6 GM/177ML PO SOLN: 1.0000 | Freq: Once | ORAL | 0 refills | Status: AC

## 2022-03-08 NOTE — Addendum Note (Signed)
Addended by: Vanetta Mulders on: 03/08/2022 10:38 AM   Modules accepted: Orders

## 2022-03-08 NOTE — Telephone Encounter (Signed)
Patient returned phone call to reschedule his colonoscopy due to poor prep.  I inquired if he understood the colonoscopy instructions prior to having the colonoscopy.  He stated that he did understand the instructions, however he got hungry and ate solid foods yesterday evening.  I explained to him that its important to follow the instructions as noted.  Clear liquid diet should be followed for a good prep.  Since colonoscopy is being rescheduled due to not following the diet per the instructions.  Colonoscopy prep has not changed.  Rx for Suprep has been sent to pharmacy.  Colonoscopy has been rescheduled to 03/17/22 with Dr. Vicente Males.  Thanks,  [place], [place]

## 2022-03-08 NOTE — Telephone Encounter (Signed)
Left voice message for patient to call office to reschedule colonoscopy with Dr. Vicente Males due to poor prep (Suprep).  Thanks,  [place], [place]

## 2022-03-16 ENCOUNTER — Encounter: Payer: Self-pay | Admitting: Gastroenterology

## 2022-03-17 ENCOUNTER — Encounter
Admission: RE | Disposition: A | Discharge status: Home / Self Care | Payer: Self-pay | Source: Home / Self Care | Attending: Gastroenterology

## 2022-03-17 ENCOUNTER — Encounter: Payer: Self-pay | Admitting: Gastroenterology

## 2022-03-17 ENCOUNTER — Ambulatory Visit: Payer: Medicaid Other | Admitting: Anesthesiology

## 2022-03-17 ENCOUNTER — Ambulatory Visit
Admission: RE | Admit: 2022-03-17 | Discharge: 2022-03-17 | Disposition: A | Discharge status: Home / Self Care | Payer: Medicaid Other | Attending: Gastroenterology | Admitting: Gastroenterology

## 2022-03-17 ENCOUNTER — Other Ambulatory Visit: Payer: Self-pay

## 2022-03-17 DIAGNOSIS — Z794 Long term (current) use of insulin: Secondary | ICD-10-CM | POA: Insufficient documentation

## 2022-03-17 DIAGNOSIS — J449 Chronic obstructive pulmonary disease, unspecified: Secondary | ICD-10-CM | POA: Diagnosis not present

## 2022-03-17 DIAGNOSIS — E119 Type 2 diabetes mellitus without complications: Secondary | ICD-10-CM | POA: Insufficient documentation

## 2022-03-17 DIAGNOSIS — Z1211 Encounter for screening for malignant neoplasm of colon: Secondary | ICD-10-CM | POA: Diagnosis present

## 2022-03-17 DIAGNOSIS — F17210 Nicotine dependence, cigarettes, uncomplicated: Secondary | ICD-10-CM | POA: Insufficient documentation

## 2022-03-17 DIAGNOSIS — I10 Essential (primary) hypertension: Secondary | ICD-10-CM | POA: Insufficient documentation

## 2022-03-17 DIAGNOSIS — Z7984 Long term (current) use of oral hypoglycemic drugs: Secondary | ICD-10-CM | POA: Insufficient documentation

## 2022-03-17 DIAGNOSIS — Z79899 Other long term (current) drug therapy: Secondary | ICD-10-CM | POA: Insufficient documentation

## 2022-03-17 DIAGNOSIS — Z7951 Long term (current) use of inhaled steroids: Secondary | ICD-10-CM | POA: Diagnosis not present

## 2022-03-17 DIAGNOSIS — F32A Depression, unspecified: Secondary | ICD-10-CM | POA: Insufficient documentation

## 2022-03-17 DIAGNOSIS — M1990 Unspecified osteoarthritis, unspecified site: Secondary | ICD-10-CM | POA: Diagnosis not present

## 2022-03-17 DIAGNOSIS — K219 Gastro-esophageal reflux disease without esophagitis: Secondary | ICD-10-CM | POA: Diagnosis not present

## 2022-03-17 HISTORY — PX: COLONOSCOPY WITH PROPOFOL: SHX5780

## 2022-03-17 LAB — GLUCOSE, CAPILLARY: Glucose-Capillary: 179 mg/dL — ABNORMAL HIGH (ref 70–99)

## 2022-03-17 SURGERY — COLONOSCOPY WITH PROPOFOL
Anesthesia: General

## 2022-03-17 MED ORDER — SODIUM CHLORIDE 0.9 % IV SOLN: INTRAVENOUS | Status: DC

## 2022-03-17 MED ORDER — PROPOFOL 500 MG/50ML IV EMUL
INTRAVENOUS | Status: DC | PRN
  Administered 2022-03-17: 140 ug/kg/min via INTRAVENOUS

## 2022-03-17 MED ORDER — LIDOCAINE HCL (CARDIAC) PF 100 MG/5ML IV SOSY
PREFILLED_SYRINGE | INTRAVENOUS | Status: DC | PRN
  Administered 2022-03-17: 60 mg via INTRAVENOUS

## 2022-03-17 MED ORDER — PROPOFOL 10 MG/ML IV BOLUS
INTRAVENOUS | Status: DC | PRN
  Administered 2022-03-17: 70 mg via INTRAVENOUS

## 2022-03-17 NOTE — Anesthesia Preprocedure Evaluation (Addendum)
Anesthesia Evaluation  Patient identified by MRN, date of birth, ID band Patient awake    Reviewed: Allergy & Precautions, NPO status , Patient's Chart, lab work & pertinent test results  History of Anesthesia Complications Negative for: history of anesthetic complications  Airway Mallampati: II  TM Distance: >3 FB Neck ROM: Full    Dental  (+) Edentulous Upper, Edentulous Lower   Pulmonary asthma , COPD,  COPD inhaler, Current Smoker and Patient abstained from smoking.,    Pulmonary exam normal breath sounds clear to auscultation       Cardiovascular hypertension, negative cardio ROS Normal cardiovascular exam Rhythm:Regular Rate:Normal     Neuro/Psych negative neurological ROS  negative psych ROS   GI/Hepatic Neg liver ROS, GERD  ,  Endo/Other  negative endocrine ROSdiabetes, Type 2, Insulin Dependent, Oral Hypoglycemic Agents  Renal/GU negative Renal ROS  negative genitourinary   Musculoskeletal  (+) Arthritis ,   Abdominal   Peds negative pediatric ROS (+)  Hematology negative hematology ROS (+)   Anesthesia Other Findings   Reproductive/Obstetrics negative OB ROS                            Anesthesia Physical Anesthesia Plan  ASA: 3  Anesthesia Plan: General   Post-op Pain Management: Minimal or no pain anticipated   Induction: Intravenous  PONV Risk Score and Plan: 1 and Propofol infusion and TIVA  Airway Management Planned: Natural Airway and Nasal Cannula  Additional Equipment:   Intra-op Plan:   Post-operative Plan:   Informed Consent: I have reviewed the patients History and Physical, chart, labs and discussed the procedure including the risks, benefits and alternatives for the proposed anesthesia with the patient or authorized representative who has indicated his/her understanding and acceptance.     Dental Advisory Given  Plan Discussed with:  Anesthesiologist, CRNA and Surgeon  Anesthesia Plan Comments: (Patient consented for risks of anesthesia including but not limited to:  - adverse reactions to medications - risk of airway placement if required - damage to eyes, teeth, lips or other oral mucosa - nerve damage due to positioning  - sore throat or hoarseness - Damage to heart, brain, nerves, lungs, other parts of body or loss of life  Patient voiced understanding.)        Anesthesia Quick Evaluation

## 2022-03-17 NOTE — H&P (Signed)
Wyline Mood, MD [address], [place], [place], [address] [address], [place], [place], [address] Phone: [phone]  Fax: [phone]  Primary Care Physician:  Eden Emms, NP   Pre-Procedure History & Physical: HPI:  Allante Whitmire. is a 61 y.o. male is here for an colonoscopy.   Past Medical History:  Diagnosis Date   Arthritis    Asthma    Degenerative arthritis    Depression    Diabetes mellitus without complication (HCC)    Hypertension     Past Surgical History:  Procedure Laterality Date   BACK SURGERY     HAND SURGERY     KNEE SURGERY     SHOULDER SURGERY      Prior to Admission medications   Medication Sig Start Date End Date Taking? Authorizing Provider  beclomethasone (QVAR) 40 MCG/ACT inhaler Inhale 2 puffs into the lungs 2 (two) times daily.   Yes [provider]  famotidine (PEPCID) 20 MG tablet Take 1 tablet (20 mg total) by mouth daily. 01/29/22  Yes Eden Emms, NP  glucose blood (ACCU-CHEK GUIDE) test strip Check sugar 2 times daily. DX E11.69 01/29/22  Yes Eden Emms, NP  LANTUS 100 UNIT/ML injection Inject 20 Units into the skin at bedtime. 07/29/21  Yes [provider]  cyclobenzaprine (FLEXERIL) 10 MG tablet Take 10 mg by mouth 3 (three) times daily as needed for muscle spasms. Patient not taking: Reported on 03/17/2022    [provider]  glipiZIDE (GLUCOTROL XL) 5 MG 24 hr tablet Take 1 tablet (5 mg total) by mouth daily with breakfast. 01/29/22   Eden Emms, NP  DULoxetine (CYMBALTA) 30 MG capsule Take 30 mg by mouth 2 (two) times daily.  02/08/19  [provider]    Allergies as of 03/08/2022 - Review Complete 03/08/2022  Allergen Reaction Noted   Pork allergy Anaphylaxis 01/24/2020   Codeine Itching 02/21/2015   Cyclobenzaprine Other (See Comments) 09/03/2013   Diclofenac sodium Other (See Comments) 09/03/2013   Esomeprazole magnesium Other (See Comments)  09/03/2013   Nsaids Other (See Comments) 09/02/2015   Amitriptyline Rash 09/03/2013    Family History  Problem Relation Age of Onset   Asthma Mother    Other Mother        brain tumor   Brain cancer Mother    Lung cancer Father    Liver cancer Father    Hypertension Father     Social History   Socioeconomic History   Marital status: Divorced    Spouse name: Not on file   Number of children: 2   Years of education: Not on file   Highest education level: Not on file  Occupational History   Not on file  Tobacco Use   Smoking status: Every Day    Packs/day: 0.50    Years: 30.00    Total pack years: 15.00    Types: Cigarettes   Smokeless tobacco: Never  Vaping Use   Vaping Use: Every day  Substance and Sexual Activity   Alcohol use: No   Drug use: No   Sexual activity: Not Currently  Other Topics Concern   Not on file  Social History Narrative   Disability    Social Determinants of Health   Financial Resource Strain: Not on file  Food Insecurity: Not on file  Transportation Needs: Not on file  Physical Activity: Not on file  Stress: Not on file  Social  Connections: Not on file  Intimate Partner Violence: Not on file    Review of Systems: See HPI, otherwise negative ROS  Physical Exam: BP (!) 139/94   Pulse (!) 113   Temp (!) 97.4 F (36.3 C) (Temporal)   Resp 16   Ht 6' (1.829 m)   Wt 75.8 kg   BMI 22.65 kg/m  General:   Alert,  pleasant and cooperative in NAD Head:  Normocephalic and atraumatic. Neck:  Supple; no masses or thyromegaly. Lungs:  Clear throughout to auscultation, normal respiratory effort.    Heart:  +S1, +S2, Regular rate and rhythm, No edema. Abdomen:  Soft, nontender and nondistended. Normal bowel sounds, without guarding, and without rebound.   Neurologic:  Alert and  oriented x4;  grossly normal neurologically.  Impression/Plan: Johnney Killian. is here for an colonoscopy to be performed for Screening colonoscopy  average risk   Risks, benefits, limitations, and alternatives regarding  colonoscopy have been reviewed with the patient.  Questions have been answered.  All parties agreeable.   Jonathon Bellows, MD  03/17/2022, 12:21 PM

## 2022-03-17 NOTE — Anesthesia Postprocedure Evaluation (Signed)
Anesthesia Post Note  Patient: Karl Weaver.  Procedure(s) Performed: COLONOSCOPY WITH PROPOFOL  Patient location during evaluation: Endoscopy Anesthesia Type: General Level of consciousness: awake and alert Pain management: pain level controlled Vital Signs Assessment: post-procedure vital signs reviewed and stable Respiratory status: spontaneous breathing, nonlabored ventilation, respiratory function stable and patient connected to nasal cannula oxygen Cardiovascular status: blood pressure returned to baseline and stable Postop Assessment: no apparent nausea or vomiting Anesthetic complications: no   No notable events documented.   Last Vitals:  Vitals:   03/17/22 1214 03/17/22 1409  BP: (!) 139/94 (!) 133/95  Pulse: (!) 113 (!) 104  Resp: 16 (!) 27  Temp: (!) 36.3 C 36.4 C  SpO2:  100%    Last Pain:  Vitals:   03/17/22 1409  TempSrc: Temporal  PainSc: Asleep                 Ilene Qua

## 2022-03-17 NOTE — Transfer of Care (Signed)
Immediate Anesthesia Transfer of Care Note  Patient: Karl Weaver.  Procedure(s) Performed: COLONOSCOPY WITH PROPOFOL  Patient Location: PACU  Anesthesia Type:General  Level of Consciousness: awake, alert  and oriented  Airway & Oxygen Therapy: Patient Spontanous Breathing  Post-op Assessment: Report given to RN and Post -op Vital signs reviewed and stable  Post vital signs: Reviewed and stable  Last Vitals:  Vitals Value Taken Time  BP    Temp    Pulse    Resp    SpO2      Last Pain:  Vitals:   03/17/22 1214  TempSrc: Temporal  PainSc: 7          Complications: No notable events documented.

## 2022-03-17 NOTE — Op Note (Signed)
[facility] Gastroenterology Patient Name: Karl Weaver Procedure Date: 03/17/2022 1:32 PM MRN: [id number] Account #: [id number] Date of Birth: 11-08-1960 Admit Type: Outpatient Age: 61 Room: [facility] ENDO ROOM 3 Gender: Male Note Status: Finalized Instrument Name: Nelda Marseille [id number] Procedure:             Colonoscopy Indications:           Screening for colorectal malignant neoplasm Providers:             Wyline Mood MD, MD Referring MD:          No Local Md, MD (Referring MD) Medicines:             Monitored Anesthesia Care Complications:         No immediate complications. Procedure:             Pre-Anesthesia Assessment:                        - Prior to the procedure, a History and Physical was                         performed, and patient medications, allergies and                         sensitivities were reviewed. The patient's tolerance                         of previous anesthesia was reviewed.                        - The risks and benefits of the procedure and the                         sedation options and risks were discussed with the                         patient. All questions were answered and informed                         consent was obtained.                        - ASA Grade Assessment: II - A patient with mild                         systemic disease.                        After obtaining informed consent, the colonoscope was                         passed under direct vision. Throughout the procedure,                         the patient's blood pressure, pulse, and oxygen                         saturations were monitored continuously. The                         Colonoscope was  introduced through the anus and                         advanced to the the cecum, identified by the                         appendiceal orifice. The colonoscopy was performed                         with ease. The patient tolerated the procedure well.                          The quality of the bowel preparation was                         unsatisfactory. Findings:      Extensive amounts of solid stool was found in the entire colon,       interfering with visualization. Impression:            - Preparation of the colon was unsatisfactory.                        - Stool in the entire examined colon.                        - No specimens collected. Recommendation:        - Discharge patient to home (with escort).                        - Advance diet as tolerated.                        - Continue present medications.                        - Repeat colonoscopy in 4 months because the bowel                         preparation was suboptimal. Procedure Code(s):     --- Professional ---                        [phone], Colonoscopy, flexible; diagnostic, including                         collection of specimen(s) by brushing or washing, when                         performed (separate procedure) Diagnosis Code(s):     --- Professional ---                        Z12.11, Encounter for screening for malignant neoplasm                         of colon CPT copyright 2019 [employer]. All rights reserved. The codes documented in this report are preliminary and upon coder review may  be revised to meet current compliance requirements. Wyline Mood, MD Wyline Mood MD, MD 03/17/2022 2:08:27 PM This report has been signed electronically. Number of Addenda: 0 Note Initiated  On: 03/17/2022 1:32 PM Scope Withdrawal Time: 0 hours 0 minutes 52 seconds  Total Procedure Duration: 0 hours 4 minutes 44 seconds  Estimated Blood Loss:  Estimated blood loss: none.      [facility]

## 2022-03-18 ENCOUNTER — Encounter: Payer: Self-pay | Admitting: Gastroenterology

## 2022-04-23 ENCOUNTER — Ambulatory Visit: Payer: Medicaid Other | Admitting: Nurse Practitioner

## 2022-04-23 VITALS — BP 136/88 | HR 102 | Temp 97.2°F | Resp 14 | Ht 72.0 in | Wt 164.1 lb

## 2022-04-23 DIAGNOSIS — Z794 Long term (current) use of insulin: Secondary | ICD-10-CM | POA: Diagnosis not present

## 2022-04-23 DIAGNOSIS — E1165 Type 2 diabetes mellitus with hyperglycemia: Secondary | ICD-10-CM

## 2022-04-23 DIAGNOSIS — K219 Gastro-esophageal reflux disease without esophagitis: Secondary | ICD-10-CM | POA: Diagnosis not present

## 2022-04-23 DIAGNOSIS — G8929 Other chronic pain: Secondary | ICD-10-CM

## 2022-04-23 DIAGNOSIS — Z23 Encounter for immunization: Secondary | ICD-10-CM

## 2022-04-23 DIAGNOSIS — F321 Major depressive disorder, single episode, moderate: Secondary | ICD-10-CM

## 2022-04-23 DIAGNOSIS — M545 Low back pain, unspecified: Secondary | ICD-10-CM | POA: Diagnosis not present

## 2022-04-23 DIAGNOSIS — N528 Other male erectile dysfunction: Secondary | ICD-10-CM

## 2022-04-23 LAB — BASIC METABOLIC PANEL
BUN: 13 mg/dL (ref 6–23)
CO2: 29 mEq/L (ref 19–32)
Calcium: 10.7 mg/dL — ABNORMAL HIGH (ref 8.4–10.5)
Chloride: 99 mEq/L (ref 96–112)
Creatinine, Ser: 0.98 mg/dL (ref 0.40–1.50)
GFR: 84.79 mL/min (ref >60.00)
Glucose, Bld: 240 mg/dL — ABNORMAL HIGH (ref 70–99)
Potassium: 4.8 mEq/L (ref 3.5–5.1)
Sodium: 134 mEq/L — ABNORMAL LOW (ref 135–145)

## 2022-04-23 LAB — POCT GLYCOSYLATED HEMOGLOBIN (HGB A1C): Hemoglobin A1C: 11.1 % — AB (ref 4.0–5.6)

## 2022-04-23 LAB — MICROALBUMIN / CREATININE URINE RATIO
Creatinine,U: 188.9 mg/dL
Microalb Creat Ratio: 2.2 mg/g (ref 0.0–30.0)
Microalb, Ur: 4.2 mg/dL — ABNORMAL HIGH (ref 0.0–1.9)

## 2022-04-23 MED ORDER — TADALAFIL 10 MG PO TABS: 10.0000 mg | ORAL_TABLET | Freq: Every day | ORAL | 1 refills | Status: DC | PRN

## 2022-04-23 MED ORDER — LANTUS 100 UNIT/ML ~~LOC~~ SOLN: 22.0000 [IU] | Freq: Every day | SUBCUTANEOUS | 1 refills | Status: DC

## 2022-04-23 MED ORDER — FAMOTIDINE 20 MG PO TABS: 20.0000 mg | ORAL_TABLET | Freq: Two times a day (BID) | ORAL | 2 refills | Status: DC

## 2022-04-23 MED ORDER — CYCLOBENZAPRINE HCL 10 MG PO TABS: 10.0000 mg | ORAL_TABLET | Freq: Three times a day (TID) | ORAL | 0 refills | Status: DC | PRN

## 2022-04-23 MED ORDER — GLIPIZIDE ER 5 MG PO TB24: 5.0000 mg | ORAL_TABLET | Freq: Every day | ORAL | 1 refills | Status: DC

## 2022-04-23 NOTE — Progress Notes (Signed)
Established Patient Office Visit  Subjective   Patient ID: Karl Farino., male    DOB: 04-May-1961  Age: 61 y.o. MRN: [id number]  Chief Complaint  Patient presents with   Diabetes    Follow up-check sugar at home sometimes- examples 170, 109 the other day.      DM2: States that he is checking his sugars twice a day. Once in the morning and once in the evening. States that he has had good number of 109. Currently on insulin and glipizide. States that he is out of insulin.  Patient currently maintained on 20 units.  States he evaluates over the past couple days.Marland Kitchen  GERD: States that he is currently on pepcid and controls it. Takes it every day  ED: Having trouble getting and maintaining erection. He will get a "half way" erection that is not strong enough for penetration.  Patient states this makes him feel like "half of a man".  He does have lady seizures that he would like to be with.  1 question if he has SI as a part of review of systems he states that he does.  History of SI in the past with attempts on life.  He has seen psychiatry in the past but his psychiatrist "graduated" and no longer sees him.  Patient states he did have passive SI last night in regards to not being able to obtain and maintain an erection.  Patient has no plan.  Patient states he has tried several antidepressants in the past inclusive of Paxil and something else that he is unsure the name of.  States they were not beneficial.      04/23/2022   12:18 PM  PHQ9 SCORE ONLY  PHQ-9 Total Score 17        04/23/2022   12:19 PM  GAD 7 : Generalized Anxiety Score  Nervous, Anxious, on Edge 3  Control/stop worrying 2  Worry too much - different things 1  Trouble relaxing 2  Restless 3  Easily annoyed or irritable 3  Afraid - awful might happen 1  Total GAD 7 Score 15  Anxiety Difficulty Very difficult         Review of Systems  Constitutional:  Negative for chills and fever.  Respiratory:   Negative for shortness of breath.   Cardiovascular:  Negative for chest pain.  Neurological:  Negative for tingling.  Psychiatric/Behavioral:  Negative for suicidal ideas.       Objective:     BP 136/88   Pulse (!) 102   Temp (!) 97.2 F (36.2 C)   Resp 14   Ht 6' (1.829 m)   Wt 164 lb 2 oz (74.4 kg)   SpO2 100%   BMI 22.26 kg/m    Physical Exam Vitals and nursing note reviewed.  Constitutional:      Appearance: Normal appearance.  Cardiovascular:     Rate and Rhythm: Normal rate and regular rhythm.     Pulses:          Dorsalis pedis pulses are 2+ on the right side and 2+ on the left side.     Heart sounds: Normal heart sounds.  Pulmonary:     Effort: Pulmonary effort is normal.     Breath sounds: Normal breath sounds.  Feet:     Right foot:     Skin integrity: Skin integrity normal.     Left foot:     Skin integrity: Skin integrity normal.  Skin:  Findings: Lesion present.  Neurological:     Mental Status: He is alert.      Results for orders placed or performed in visit on 04/23/22  POCT glycosylated hemoglobin (Hb A1C)  Result Value Ref Range   Hemoglobin A1C 11.1 (A) 4.0 - 5.6 %   HbA1c POC (<> result, manual entry)     HbA1c, POC (prediabetic range)     HbA1c, POC (controlled diabetic range)        The 10-year ASCVD risk score (Arnett DK, et al., 2019) is: 22.4%    Assessment & Plan:   Problem List Items Addressed This Visit       Digestive   GERD (gastroesophageal reflux disease)    Patient currently maintained on Pepcid 20 mg twice daily.  Continue medication as prescribed refill provided today.      Relevant Medications   famotidine (PEPCID) 20 MG tablet     Endocrine   Type 2 diabetes mellitus with hyperglycemia, with long-term current use of insulin (HCC) - Primary    Patient's A1c down to 11.1 today.  Patient has been out of Lantus for approximately 2 to 3 days.  Will increase Lantus from 20 units to 22 units.  Instructed  patient if fasting glucose is greater than 154 days in a row he can increase by 2 more units.  Used to be on 30 units in the past.  Continue glipizide 5 mg XL.      Relevant Medications   famotidine (PEPCID) 20 MG tablet   glipiZIDE (GLUCOTROL XL) 5 MG 24 hr tablet   LANTUS 100 UNIT/ML injection   Other Relevant Orders   POCT glycosylated hemoglobin (Hb A1C) (Completed)   Microalbumin/Creatinine Ratio, Urine   Basic metabolic panel     Other   Chronic low back pain   Relevant Medications   cyclobenzaprine (FLEXERIL) 10 MG tablet   Erectile dysfunction    Patient has been evaluated by urology in the past.  States he has been on sildenafil that was not beneficial.  States urology did mention about something about injections but did not pursue at that juncture.  We will try patient on Cialis 10 mg daily as needed sexual activity.  If this is not beneficial we will refer patient back to urology for further evaluation and possible injections.      Relevant Medications   famotidine (PEPCID) 20 MG tablet   tadalafil (CIALIS) 10 MG tablet   Hyperlipidemia   Relevant Medications   famotidine (PEPCID) 20 MG tablet   tadalafil (CIALIS) 10 MG tablet   Current moderate episode of major depressive disorder (HCC)    PHQ-9 and GAD-7 administered in office.  Patient did express passive SI without plan or intent to complete.  Seems to be stemming around patient's erectile dysfunction.  Did offer to put patient on some medications he declined stating they had not worked in the past.  Used to see psychiatry.  Ambulatory referral made for psychiatry urgently      Relevant Orders   Ambulatory referral to Psychiatry    Return in about 3 months (around 07/24/2022) for DM recheck .    Audria Nine, NP

## 2022-04-23 NOTE — Assessment & Plan Note (Signed)
Patient has been evaluated by urology in the past.  States he has been on sildenafil that was not beneficial.  States urology did mention about something about injections but did not pursue at that juncture.  We will try patient on Cialis 10 mg daily as needed sexual activity.  If this is not beneficial we will refer patient back to urology for further evaluation and possible injections.

## 2022-04-23 NOTE — Patient Instructions (Signed)
Nice to see you today I will be in touch with the labs once I have them Follow up with the urgent care as directed I increased the lantus to 22 units.  Follow up with me in 3 months, sooner if you need me

## 2022-04-23 NOTE — Assessment & Plan Note (Signed)
Patient currently maintained on Pepcid 20 mg twice daily.  Continue medication as prescribed refill provided today.

## 2022-04-23 NOTE — Assessment & Plan Note (Signed)
Patient's A1c down to 11.1 today.  Patient has been out of Lantus for approximately 2 to 3 days.  Will increase Lantus from 20 units to 22 units.  Instructed patient if fasting glucose is greater than 154 days in a row he can increase by 2 more units.  Used to be on 30 units in the past.  Continue glipizide 5 mg XL.

## 2022-04-23 NOTE — Assessment & Plan Note (Signed)
PHQ-9 and GAD-7 administered in office.  Patient did express passive SI without plan or intent to complete.  Seems to be stemming around patient's erectile dysfunction.  Did offer to put patient on some medications he declined stating they had not worked in the past.  Used to see psychiatry.  Ambulatory referral made for psychiatry urgently

## 2022-05-13 ENCOUNTER — Other Ambulatory Visit: Payer: Self-pay

## 2022-05-13 MED ORDER — INSULIN PEN NEEDLE 31G X 8 MM MISC: 3 refills | Status: DC

## 2022-05-13 NOTE — Telephone Encounter (Signed)
Fax from [facility] for refill for syringe for insulin

## 2022-06-03 ENCOUNTER — Other Ambulatory Visit: Payer: Self-pay | Admitting: Nurse Practitioner

## 2022-06-03 DIAGNOSIS — E1165 Type 2 diabetes mellitus with hyperglycemia: Secondary | ICD-10-CM

## 2022-06-24 ENCOUNTER — Encounter: Payer: Self-pay | Admitting: Psychiatry

## 2022-06-24 ENCOUNTER — Other Ambulatory Visit
Admission: RE | Admit: 2022-06-24 | Discharge: 2022-06-24 | Disposition: A | Discharge status: Home / Self Care | Payer: Medicaid Other | Source: Ambulatory Visit | Attending: Psychiatry | Admitting: Psychiatry

## 2022-06-24 ENCOUNTER — Ambulatory Visit: Payer: Medicaid Other | Admitting: Psychiatry

## 2022-06-24 VITALS — BP 111/73 | HR 120 | Temp 97.9°F | Ht 69.5 in | Wt 166.0 lb

## 2022-06-24 DIAGNOSIS — F4310 Post-traumatic stress disorder, unspecified: Secondary | ICD-10-CM | POA: Insufficient documentation

## 2022-06-24 DIAGNOSIS — F1121 Opioid dependence, in remission: Secondary | ICD-10-CM | POA: Diagnosis not present

## 2022-06-24 DIAGNOSIS — F1021 Alcohol dependence, in remission: Secondary | ICD-10-CM | POA: Insufficient documentation

## 2022-06-24 DIAGNOSIS — Z79899 Other long term (current) drug therapy: Secondary | ICD-10-CM | POA: Insufficient documentation

## 2022-06-24 DIAGNOSIS — F1421 Cocaine dependence, in remission: Secondary | ICD-10-CM | POA: Diagnosis not present

## 2022-06-24 DIAGNOSIS — R000 Tachycardia, unspecified: Secondary | ICD-10-CM | POA: Insufficient documentation

## 2022-06-24 MED ORDER — VILAZODONE HCL 20 MG PO TABS: 20.0000 mg | ORAL_TABLET | Freq: Every day | ORAL | 1 refills | Status: DC

## 2022-06-24 MED ORDER — VILAZODONE HCL 10 MG PO TABS: 10.0000 mg | ORAL_TABLET | Freq: Every day | ORAL | 0 refills | Status: DC
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
## 2022-06-24 NOTE — Patient Instructions (Signed)
 Vilazodone Tablets What is this medication? VILAZODONE (vil AZ oh done) treats depression. It increases the amount of serotonin in the brain, a substance that helps regulate mood. This medicine may be used for other purposes; ask your health care provider or pharmacist if you have questions. COMMON BRAND NAME(S): VIIBRYD What should I tell my care team before I take this medication? They need to know if you have any of these conditions: Bipolar disorder or a family history of bipolar disorder Glaucoma Liver disease Low levels of sodium in the blood Receiving electroconvulsive therapy Seizures (convulsions) Suicidal thoughts, plans, or attempt by you or a family member An unusual or allergic reaction to vilazodone, other medications, foods, dyes or preservatives Pregnant or trying to get pregnant Breast-feeding How should I use this medication? Take this medication by mouth with water. Take it as directed on the prescription label at the same time every day. Take it with food. Keep taking it unless your care team tells you to stop. A special MedGuide will be given to you by the pharmacist with each prescription and refill. Be sure to read this information carefully each time. Talk to your care team about the use of this medication in children. Special care may be needed. Overdosage: If you think you have taken too much of this medicine contact a poison control center or emergency room at once. NOTE: This medicine is only for you. Do not share this medicine with others. What if I miss a dose? If you miss a dose, take it as soon as you can. If it is almost time for your next dose, take only that dose. Do not take double or extra doses. What may interact with this medication? Do not take this medication with any of the following: Linezolid MAOIs like Carbex, Eldepryl, Marplan, Nardil, and Parnate Methylene blue (injected into a vein) This medication may also interact with the  following: Amphetamines Aspirin and aspirin-like medications Buspirone Certain diet medications like dexfenfluramine, fenfluramine, phentermine, sibutramine Certain migraine headache medications like almotriptan, eletriptan, frovatriptan, naratriptan, rizatriptan, sumatriptan, zolmitriptan Certain medications that treat or prevent blood clots like warfarin, enoxaparin, and dalteparin Certain medications that treat infections like clarithromycin, itraconazole, voriconazole, ketoconazole, rifampin Certain medications that treat seizures like carbamazepine and phenytoin Digoxin Fentanyl Lithium NSAIDS, medications for pain and inflammation, like ibuprofen or naproxen Other medications for depression, anxiety, or psychotic disturbances St. John's Wort Tramadol Tryptophan This list may not describe all possible interactions. Give your health care provider a list of all the medicines, herbs, non-prescription drugs, or dietary supplements you use. Also tell them if you smoke, drink alcohol, or use illegal drugs. Some items may interact with your medicine. What should I watch for while using this medication? Tell your care team if your symptoms do not get better or if they get worse. Visit your care team for regular checks on your progress. Because it may take several weeks to see the full effects of this medication, it is important to continue your treatment as prescribed by your care team. Patients and their families should watch out for new or worsening thoughts of suicide or depression. Also watch out for sudden changes in feelings such as feeling anxious, agitated, panicky, irritable, hostile, aggressive, impulsive, severely restless, overly excited and hyperactive, or not being able to sleep. If this happens, especially at the beginning of treatment or after a change in dose, call your care team. You may get drowsy or dizzy. Do not drive, use machinery, or do   anything that needs mental alertness  until you know how this medication affects you. Do not stand or sit up quickly, especially if you are an older patient. This reduces the risk of dizzy or fainting spells. Alcohol may interfere with the effect of this medication. Avoid alcoholic drinks. Your mouth may get dry. Chewing sugarless gum or sucking hard candy, and drinking plenty of water may help. Contact your care team if the problem does not go away or is severe. What side effects may I notice from receiving this medication? Side effects that you should report to your care team as soon as possible: Allergic reactions--skin rash, itching, hives, swelling of the face, lips, tongue, or throat Bleeding--bloody or black, tar-like stools, vomiting blood or brown material that looks like coffee grounds, red or dark brown urine, small red or purple spots on skin, unusual bruising or bleeding Irritability, confusion, fast or irregular heartbeat, muscle stiffness, twitching muscles, sweating, high fever, seizure, chills, vomiting, diarrhea, which may be signs of serotonin syndrome Low sodium level--muscle weakness, fatigue, dizziness, headache, confusion Seizures Sudden eye pain or change in vision such as blurry vision, seeing halos around lights, vision loss Thoughts of suicide or self-harm, worsening mood, feelings of depression Side effects that usually do not require medical attention (report to your care team if they continue or are bothersome): Change in sex drive or performance Diarrhea Dry mouth Headache Nausea Trouble sleeping Vomiting This list may not describe all possible side effects. Call your doctor for medical advice about side effects. You may report side effects to FDA at 1-800-FDA-1088. Where should I keep my medication? Keep out of the reach of children. Store at room temperature between 15 and 30 degrees C (59 to 86 degrees F). Throw away any unused medication after the expiration date. NOTE: This sheet is a summary.  It may not cover all possible information. If you have questions about this medicine, talk to your doctor, pharmacist, or health care provider.  2023 Elsevier/Gold Standard (2020-12-26 00:00:00)  

## 2022-06-29 LAB — URINE DRUGS OF ABUSE SCREEN W ALC, ROUTINE (REF LAB)
Amphetamines, Urine: NEGATIVE ng/mL
Barbiturate, Ur: NEGATIVE ng/mL
Benzodiazepine Quant, Ur: NEGATIVE ng/mL
Cocaine (Metab.): NEGATIVE ng/mL
Ethanol U, Quan: NEGATIVE %
Methadone Screen, Urine: NEGATIVE ng/mL
Opiate Quant, Ur: NEGATIVE ng/mL
Phencyclidine, Ur: NEGATIVE ng/mL
Propoxyphene, Urine: NEGATIVE ng/mL

## 2022-06-29 LAB — PANEL 799049
CARBOXY THC GC/MS CONF: >750 ng/mL
Cannabinoid GC/MS, Ur: POSITIVE — AB

## 2022-07-26 ENCOUNTER — Ambulatory Visit (INDEPENDENT_AMBULATORY_CARE_PROVIDER_SITE_OTHER): Payer: Medicaid Other | Admitting: Nurse Practitioner

## 2022-07-26 ENCOUNTER — Telehealth: Payer: Self-pay | Admitting: Nurse Practitioner

## 2022-07-26 ENCOUNTER — Encounter: Payer: Self-pay | Admitting: Nurse Practitioner

## 2022-07-26 VITALS — BP 136/84 | HR 68 | Temp 98.0°F | Resp 16 | Ht 69.5 in | Wt 180.4 lb

## 2022-07-26 DIAGNOSIS — L0291 Cutaneous abscess, unspecified: Secondary | ICD-10-CM

## 2022-07-26 DIAGNOSIS — Z794 Long term (current) use of insulin: Secondary | ICD-10-CM | POA: Diagnosis not present

## 2022-07-26 DIAGNOSIS — E1165 Type 2 diabetes mellitus with hyperglycemia: Secondary | ICD-10-CM

## 2022-07-26 DIAGNOSIS — E1159 Type 2 diabetes mellitus with other circulatory complications: Secondary | ICD-10-CM | POA: Diagnosis not present

## 2022-07-26 DIAGNOSIS — I152 Hypertension secondary to endocrine disorders: Secondary | ICD-10-CM

## 2022-07-26 DIAGNOSIS — N529 Male erectile dysfunction, unspecified: Secondary | ICD-10-CM

## 2022-07-26 LAB — POCT GLYCOSYLATED HEMOGLOBIN (HGB A1C): Hemoglobin A1C: 12 % — AB (ref 4.0–5.6)

## 2022-07-26 MED ORDER — LANTUS 100 UNIT/ML ~~LOC~~ SOLN: 30.0000 [IU] | Freq: Every day | SUBCUTANEOUS | 1 refills | Status: DC

## 2022-07-26 MED ORDER — SULFAMETHOXAZOLE-TRIMETHOPRIM 800-160 MG PO TABS: 1.0000 | ORAL_TABLET | Freq: Two times a day (BID) | ORAL | 0 refills | Status: AC

## 2022-07-26 NOTE — Assessment & Plan Note (Signed)
Patient's A1c went up to 12.0%.  States has been doing 30 units of Lantus not 22 and taking of glipizide the patient is been having nonadherence to therapy encourage patient to take medication as prescribed every day will not make any medication adjustments as patient is not been taking medication every day.  Did discuss that getting blood glucose under control may help with some of his erectile dysfunction

## 2022-07-26 NOTE — Progress Notes (Signed)
Established Patient Office Visit  Subjective   Patient ID: Karl Bonnici., male    DOB: 1960/08/06  Age: 62 y.o. MRN: [id number]  Chief Complaint  Patient presents with   Diabetes      DM2: checking sugars states that he is checking glucose once a week. Hypoglycemia: state no low glucoses on Hyperglycemia: 140 Lantus 22 units daily along with glipizide 35m daily  Last A1C 12.0-> 11.1 increased lantus by 2 units ->12.0 Weight gain of 14 pounds Been snacking  Patient states he has not been taking the medication as prescribed as he forgets some days may be getting in 3-4 times a week.  ED: Has been seen by urology in the past and was tried on sildenafil without great relief.  He did mention last office visit I had talked about doing injections.  We did trial patient on tadalafil 10 mg. States that it was no benefical. [place]: Patient was referred to psychiatry last office visit.  Patient has seen Dr. EShea Evansonce since his last office visit.  Patient was started on the LHerbie Baltimorerefer to trauma focused therapy of note patient was tachycardic in session with psychiatry.  They deferred to primary care for management. States medication is too expensive      Review of Systems  Constitutional:  Negative for chills (hands and feet feel cold) and fever.  Respiratory:  Negative for shortness of breath.   Cardiovascular:  Negative for chest pain.  Gastrointestinal:  Positive for nausea. Negative for abdominal pain, blood in stool, constipation, diarrhea and vomiting.       BM every other day   Neurological:  Negative for headaches.  Psychiatric/Behavioral:  Negative for hallucinations and suicidal ideas.       Objective:     BP 136/84   Pulse 68   Temp 98 F (36.7 C)   Resp 16   Ht 5' 9.5" (1.765 m)   Wt 180 lb 6 oz (81.8 kg)   SpO2 99%   BMI 26.25 kg/m  BP Readings from Last 3 Encounters:  07/26/22 136/84   06/24/22 111/73  04/23/22 136/88   Wt Readings from Last 3 Encounters:  07/26/22 180 lb 6 oz (81.8 kg)  06/24/22 166 lb (75.3 kg)  04/23/22 164 lb 2 oz (74.4 kg)      Physical Exam Vitals and nursing note reviewed.  Constitutional:      Appearance: Normal appearance.  Cardiovascular:     Rate and Rhythm: Normal rate and regular rhythm.     Pulses:          Dorsalis pedis pulses are 2+ on the right side and 2+ on the left side.       Posterior tibial pulses are 2+ on the right side and 2+ on the left side.     Heart sounds: Normal heart sounds.  Pulmonary:     Effort: Pulmonary effort is normal.     Breath sounds: Normal breath sounds.  Musculoskeletal:       Feet:  Feet:     Right foot:     Skin integrity: Callus and dry skin present.     Left foot:     Skin integrity: Dry skin present.  Skin:    General: Skin is warm.       Neurological:     Mental Status: He is alert.      Results for orders placed  or performed in visit on 07/26/22  POCT glycosylated hemoglobin (Hb A1C)  Result Value Ref Range   Hemoglobin A1C 12.0 (A) 4.0 - 5.6 %   HbA1c POC (<> result, manual entry)     HbA1c, POC (prediabetic range)     HbA1c, POC (controlled diabetic range)        The 10-year ASCVD risk score (Arnett DK, et al., 2019) is: 23.6%    Assessment & Plan:   Problem List Items Addressed This Visit       Cardiovascular and Mediastinum   Hypertension associated with diabetes ([place])    As of late has been maintained on diet.  Patient has gained weight since last office of blood pressure borderline today we will continue to monitor continue working on healthy lifestyle modifications      Relevant Medications   LANTUS 100 UNIT/ML injection     Endocrine   Type 2 diabetes mellitus with hyperglycemia, with long-term current use of insulin ([place]) - Primary    Patient's A1c went up to 12.0%.  States has been doing 30 units of Lantus not 22 and taking of glipizide the  patient is been having nonadherence to therapy encourage patient to take medication as prescribed every day will not make any medication adjustments as patient is not been taking medication every day.  Did discuss that getting blood glucose under control may help with some of his erectile dysfunction      Relevant Medications   LANTUS 100 UNIT/ML injection   Other Relevant Orders   POCT glycosylated hemoglobin (Hb A1C) (Completed)     Other   Erectile dysfunction    Patient has trialed sildenafil and tadalafil without great success.  He was followed by [facility] urology patient does not will return to the clinic.  States he did mention about injections into the penis ambulatory referral to urology in [facility] for further evaluation and workup for erectile dysfunction      Relevant Orders   Ambulatory referral to Urology   Abscess    History of recurrent abscesses.  Patient has 2 spots on his left superior posterior shoulder that is red and draining purulent discharge.  Will put patient on Bactrim twice daily for 5 days      Relevant Medications   sulfamethoxazole-trimethoprim (BACTRIM DS) 800-160 MG tablet    Return in about 3 months (around 10/24/2022) for DM recheck.    Romilda Garret, NP

## 2022-07-26 NOTE — Telephone Encounter (Signed)
Dr. Shea Evans,   I saw Sire in office and he states that the Vilazodone is too expensive and he has not been able to get the medication.  Thanks, [employer]

## 2022-07-26 NOTE — Telephone Encounter (Signed)
Matt, We went over several medication options and he was not interested in SSRI/SNRI or TCA s due to side effect concerns. That is why this particular medication was chosen.  Thank you . Clarise Cruz

## 2022-07-26 NOTE — Assessment & Plan Note (Signed)
History of recurrent abscesses.  Patient has 2 spots on his left superior posterior shoulder that is red and draining purulent discharge.  Will put patient on Bactrim twice daily for 5 days

## 2022-07-26 NOTE — Assessment & Plan Note (Signed)
Patient has trialed sildenafil and tadalafil without great success.  He was followed by [facility] urology patient does not will return to the clinic.  States he did mention about injections into the penis ambulatory referral to urology in [place] for further evaluation and workup for erectile dysfunction

## 2022-07-26 NOTE — Patient Instructions (Signed)
Nice to see you today It is important that you take your prescribed medications as directed every day Follow up with me in 3 months. Sooner if you need me

## 2022-07-26 NOTE — Assessment & Plan Note (Signed)
As of late has been maintained on diet.  Patient has gained weight since last office of blood pressure borderline today we will continue to monitor continue working on healthy lifestyle modifications

## 2022-08-04 ENCOUNTER — Ambulatory Visit: Payer: Medicaid Other | Admitting: Urology

## 2022-08-30 ENCOUNTER — Ambulatory Visit: Payer: Medicaid Other | Admitting: Psychiatry

## 2022-10-25 ENCOUNTER — Ambulatory Visit: Payer: Medicaid Other | Admitting: Nurse Practitioner

## 2022-11-13 ENCOUNTER — Emergency Department: Payer: Medicaid Other

## 2022-11-13 ENCOUNTER — Emergency Department
Admission: EM | Admit: 2022-11-13 | Discharge: 2022-11-14 | Disposition: A | Discharge status: Home / Self Care | Payer: Medicaid Other | Attending: Emergency Medicine | Admitting: Emergency Medicine

## 2022-11-13 ENCOUNTER — Other Ambulatory Visit: Payer: Self-pay

## 2022-11-13 DIAGNOSIS — L0231 Cutaneous abscess of buttock: Secondary | ICD-10-CM | POA: Diagnosis not present

## 2022-11-13 DIAGNOSIS — N400 Enlarged prostate without lower urinary tract symptoms: Secondary | ICD-10-CM | POA: Insufficient documentation

## 2022-11-13 DIAGNOSIS — R339 Retention of urine, unspecified: Secondary | ICD-10-CM

## 2022-11-13 DIAGNOSIS — E1165 Type 2 diabetes mellitus with hyperglycemia: Secondary | ICD-10-CM | POA: Insufficient documentation

## 2022-11-13 DIAGNOSIS — L02416 Cutaneous abscess of left lower limb: Secondary | ICD-10-CM | POA: Diagnosis not present

## 2022-11-13 DIAGNOSIS — I10 Essential (primary) hypertension: Secondary | ICD-10-CM | POA: Insufficient documentation

## 2022-11-13 DIAGNOSIS — Z794 Long term (current) use of insulin: Secondary | ICD-10-CM | POA: Insufficient documentation

## 2022-11-13 DIAGNOSIS — R5383 Other fatigue: Secondary | ICD-10-CM | POA: Diagnosis present

## 2022-11-13 DIAGNOSIS — L0291 Cutaneous abscess, unspecified: Secondary | ICD-10-CM

## 2022-11-13 LAB — URINALYSIS, ROUTINE W REFLEX MICROSCOPIC
Bacteria, UA: NONE SEEN
Bilirubin Urine: NEGATIVE
Glucose, UA: >=500 mg/dL — AB
Hgb urine dipstick: NEGATIVE
Ketones, ur: NEGATIVE mg/dL
Leukocytes,Ua: NEGATIVE
Nitrite: NEGATIVE
Protein, ur: 30 mg/dL — AB
Specific Gravity, Urine: 1.028 (ref 1.005–1.030)
pH: 6 (ref 5.0–8.0)

## 2022-11-13 LAB — CBC
HCT: 38.2 % — ABNORMAL LOW (ref 39.0–52.0)
Hemoglobin: 13 g/dL (ref 13.0–17.0)
MCH: 29.6 pg (ref 26.0–34.0)
MCHC: 34 g/dL (ref 30.0–36.0)
MCV: 87 fL (ref 80.0–100.0)
Platelets: 433 10*3/uL — ABNORMAL HIGH (ref 150–400)
RBC: 4.39 MIL/uL (ref 4.22–5.81)
RDW: 11.8 % (ref 11.5–15.5)
WBC: 16.8 10*3/uL — ABNORMAL HIGH (ref 4.0–10.5)
nRBC: 0 % (ref 0.0–0.2)

## 2022-11-13 LAB — BASIC METABOLIC PANEL
Anion gap: 10 (ref 5–15)
BUN: 14 mg/dL (ref 6–20)
CO2: 22 mmol/L (ref 22–32)
Calcium: 9.2 mg/dL (ref 8.9–10.3)
Chloride: 101 mmol/L (ref 98–111)
Creatinine, Ser: 0.96 mg/dL (ref 0.61–1.24)
GFR, Estimated: >60 mL/min (ref >60)
Glucose, Bld: 278 mg/dL — ABNORMAL HIGH (ref 70–99)
Potassium: 3.8 mmol/L (ref 3.5–5.1)
Sodium: 133 mmol/L — ABNORMAL LOW (ref 135–145)

## 2022-11-13 LAB — CBG MONITORING, ED: Glucose-Capillary: 275 mg/dL — ABNORMAL HIGH (ref 70–99)

## 2022-11-13 MED ORDER — SODIUM CHLORIDE 0.9 % IV SOLN
2.0000 g | Freq: Once | INTRAVENOUS | Status: AC
  Administered 2022-11-13: 2 g via INTRAVENOUS
  Filled 2022-11-13: qty 20

## 2022-11-13 MED ORDER — IOHEXOL 300 MG/ML  SOLN
100.0000 mL | Freq: Once | INTRAMUSCULAR | Status: AC | PRN
  Administered 2022-11-13: 100 mL via INTRAVENOUS

## 2022-11-13 MED ORDER — CEPHALEXIN 500 MG PO CAPS: 500.0000 mg | ORAL_CAPSULE | Freq: Three times a day (TID) | ORAL | 0 refills | Status: AC

## 2022-11-13 MED ORDER — SULFAMETHOXAZOLE-TRIMETHOPRIM 800-160 MG PO TABS
1.0000 | ORAL_TABLET | Freq: Once | ORAL | Status: AC
  Administered 2022-11-14: 1 via ORAL
  Filled 2022-11-13: qty 1

## 2022-11-13 MED ORDER — LIDOCAINE HCL (PF) 1 % IJ SOLN
5.0000 mL | Freq: Once | INTRAMUSCULAR | Status: AC
  Administered 2022-11-13: 5 mL via INTRADERMAL
  Filled 2022-11-13: qty 5

## 2022-11-13 MED ORDER — SODIUM CHLORIDE 0.9 % IV BOLUS
1000.0000 mL | Freq: Once | INTRAVENOUS | Status: AC
  Administered 2022-11-13: 1000 mL via INTRAVENOUS

## 2022-11-13 MED ORDER — SULFAMETHOXAZOLE-TRIMETHOPRIM 800-160 MG PO TABS: 1.0000 | ORAL_TABLET | Freq: Two times a day (BID) | ORAL | 0 refills | Status: AC

## 2022-11-13 NOTE — ED Provider Notes (Signed)
[facility] Provider Note    Event Date/Time   First MD Initiated Contact with Patient 11/13/22 2144     (approximate)   History   Chief Complaint: Hyperglycemia   HPI  Karl Weaver. is a 62 y.o. male with a history of GERD, hypertension, diabetes who comes ED complaining of feeling fatigued and malaise, gradual onset for the last 2 days.  Notes that he has several "boils" with swelling and purulent drainage.  He does have a history of multiple skin infections in the past.  He is on Lantus which he takes daily.  He has primary care follow-up.  Denies chest pain shortness of breath fevers chills dizziness syncope.     Physical Exam   Triage Vital Signs: ED Triage Vitals  Enc Vitals Group     BP 11/13/22 2130 99/67     Pulse Rate 11/13/22 2130 100     Resp 11/13/22 2130 16     Temp 11/13/22 2130 98.3 F (36.8 C)     Temp Source 11/13/22 2130 Oral     SpO2 11/13/22 2130 99 %     Weight 11/13/22 2130 188 lb (85.3 kg)     Height 11/13/22 2130 6' (1.829 m)     Head Circumference --      Peak Flow --      Pain Score 11/13/22 2129 0     Pain Loc --      Pain Edu? --      Excl. in GC? --     Most recent vital signs: Vitals:   11/13/22 2130  BP: 99/67  Pulse: 100  Resp: 16  Temp: 98.3 F (36.8 C)  SpO2: 99%    General: Awake, no distress.  CV:  Good peripheral perfusion.  Regular rate and rhythm Resp:  Normal effort.  Clear to auscultation bilaterally Abd:  No distention.  Soft nontender Other:  Patient has a cutaneous abscess overlying the left hip greater trochanter.  This is spontaneously draining into places.  Expression of the wound drains purulent fluid.  No crepitus.  There is a 2 cm abscess at the left side of the gluteal cleft which is spontaneously draining purulent fluid.    There is a 5 cm fluctuant cutaneous abscess on the right side of the gluteal cleft.  Mild surrounding cellulitis.  No crepitus.  No skin  necrosis.   ED Results / Procedures / Treatments   Labs (all labs ordered are listed, but only abnormal results are displayed) Labs Reviewed  BASIC METABOLIC PANEL - Abnormal; Notable for the following components:      Result Value   Sodium 133 (*)    Glucose, Bld 278 (*)    All other components within normal limits  CBC - Abnormal; Notable for the following components:   WBC 16.8 (*)    HCT 38.2 (*)    Platelets 433 (*)    All other components within normal limits  URINALYSIS, ROUTINE W REFLEX MICROSCOPIC - Abnormal; Notable for the following components:   Color, Urine YELLOW (*)    APPearance CLEAR (*)    Glucose, UA >=500 (*)    Protein, ur 30 (*)    All other components within normal limits  CBG MONITORING, ED - Abnormal; Notable for the following components:   Glucose-Capillary 275 (*)    All other components within normal limits     EKG    RADIOLOGY CT abdomen pelvis interpreted by me, negative for  acute intra-abdominal findings.  Bladder is distended with approximately 1100 mL fluid volume.  No evidence of gas-forming infection in the perineum.  Radiology report reviewed, confirming 4 cm perianal abscess.   PROCEDURES:  .Marland KitchenIncision and Drainage  Date/Time: 11/13/2022 11:58 PM  Performed by: Sharman Cheek, MD Authorized by: Sharman Cheek, MD   Consent:    Consent obtained:  Verbal   Consent given by:  Patient   Risks discussed:  Bleeding, infection, incomplete drainage and pain   Alternatives discussed:  Alternative treatment, delayed treatment and observation Universal protocol:    Patient identity confirmed:  Verbally with patient Location:    Type:  Abscess   Size:  4cm   Location:  Anogenital   Anogenital location:  Perianal Pre-procedure details:    Skin preparation:  Povidone-iodine Sedation:    Sedation type:  None Anesthesia:    Anesthesia method:  Local infiltration   Local anesthetic:  Lidocaine 1% w/o epi Procedure type:     Complexity:  Complex Procedure details:    Ultrasound guidance: no     Needle aspiration: no     Incision types:  Single straight   Incision depth:  Subcutaneous   Wound management:  Probed and deloculated, irrigated with saline and extensive cleaning   Drainage:  Purulent   Drainage amount:  Copious   Wound treatment:  Wound left open   Packing materials:  1/4 in iodoform gauze Post-procedure details:    Procedure completion:  Tolerated well, no immediate complications    MEDICATIONS ORDERED IN ED: Medications  sulfamethoxazole-trimethoprim (BACTRIM DS) 800-160 MG per tablet 1 tablet (has no administration in time range)  sodium chloride 0.9 % bolus 1,000 mL (1,000 mLs Intravenous New Bag/Given 11/13/22 2236)  cefTRIAXone (ROCEPHIN) 2 g in sodium chloride 0.9 % 100 mL IVPB (2 g Intravenous New Bag/Given 11/13/22 2236)  lidocaine (PF) (XYLOCAINE) 1 % injection 5 mL (5 mLs Intradermal Given by Other 11/13/22 2251)  iohexol (OMNIPAQUE) 300 MG/ML solution 100 mL (100 mLs Intravenous Contrast Given 11/13/22 2304)     IMPRESSION / MDM / ASSESSMENT AND PLAN / ED COURSE  I reviewed the triage vital signs and the nursing notes.  DDx: Cutaneous abscess, AKI, electrolyte abnormality, metabolic acidosis, UTI  Patient's presentation is most consistent with acute presentation with potential threat to life or bodily function.  Patient presents with multiple cutaneous abscesses in the setting of insulin-dependent diabetes with mild hyperglycemia.  He is not septic, he is calm and nontoxic.  2 of his abscesses are spontaneously draining.  The third required incision and drainage.  Lab workup and CT abdomen pelvis does not reveal any other acute severe findings.  I estimate patient has approximately a liter of urine retained in his bladder.  Recommended Foley catheter, but patient refuses.  States that he has had this in the past.    Stable for discharge on Keflex and Bactrim.  Return precautions  discussed.       FINAL CLINICAL IMPRESSION(S) / ED DIAGNOSES   Final diagnoses:  Abscess of multiple sites  Type 2 diabetes mellitus with hyperglycemia, with long-term current use of insulin (HCC)  Urinary retention  Enlarged prostate     Rx / DC Orders   ED Discharge Orders          Ordered    cephALEXin (KEFLEX) 500 MG capsule  3 times daily        11/13/22 2351    sulfamethoxazole-trimethoprim (BACTRIM DS) 800-160 MG tablet  2 times daily  11/13/22 2351             Note:  This document was prepared using Dragon voice recognition software and may include unintentional dictation errors.   Sharman Cheek, MD 11/13/22 [phone]

## 2022-11-13 NOTE — ED Triage Notes (Addendum)
Pt to ed from home for possible hyperglycemia. Pt is known diabetic. Pt is caox4, in no acute distress and in wheel chair in triage. Pt manages his BGL at home with insulin but pt doesn't check his sugars at home.

## 2022-11-16 ENCOUNTER — Telehealth: Payer: Self-pay

## 2022-11-16 NOTE — Transitions of Care (Post Inpatient/ED Visit) (Unsigned)
I spoke with pt;pt said was seen at ED and pt received I&D of skin abscess and pt felt better after abscess were opened and drained. Pt also dx with UTI. Reviewed pts med list mand pt is aware how to take his meds. Pt is supposed to take abxs for 10 days and  then fU with PCP and urology. Pt did not want to schedule appts forn FU today. Pt is aware how to contact [facility] for appt. UC & ED precautions given and pt voiced understanding.Sending note to Audria Nine NP.      11/16/2022  Name: Karl Weaver. MRN: [id number] DOB: Feb 01, 1961  Today's TOC FU Call Status: Today's TOC FU Call Status:: Successful TOC FU Call Competed TOC FU Call Complete Date: 11/16/22  Transition Care Management Follow-up Telephone Call Date of Discharge: 11/14/22 Discharge Facility: [facility]) Type of Discharge: Emergency Department Reason for ED Visit: Other: (skin abscess and UTI; I&D was done and pt felt better.) How have you been since you were released from the hospital?: Better Any questions or concerns?: No  Items Reviewed: Did you receive and understand the discharge instructions provided?: Yes Medications obtained,verified, and reconciled?: Yes (Medications Reviewed) (pt had understanding of how to take his meds.) Any new allergies since your discharge?: No Dietary orders reviewed?: NA Do you have support at home?: No (pt doesnot have anyone at home to help if needs assistance but pt is aware how to call 911 if needs assistance.)  Medications Reviewed Today: Medications Reviewed Today     Reviewed by Louanna Raw, CPhT (Pharmacy Technician) on 11/13/22 at 2154  Med List Status: Complete   Medication Order Taking? Sig Documenting Provider Last Dose Status Informant  beclomethasone (QVAR) 40 MCG/ACT inhaler [id number] No Inhale 2 puffs into the lungs 2 (two) times daily.  Patient not taking: Reported on 11/13/2022   [provider] Not Taking Active Self   famotidine (PEPCID) 20 MG tablet [id number] No Take 1 tablet (20 mg total) by mouth 2 (two) times daily.  Patient not taking: Reported on 11/13/2022   Eden Emms, NP Not Taking Active   glipiZIDE (GLUCOTROL XL) 5 MG 24 hr tablet [id number] Yes Take 1 tablet (5 mg total) by mouth daily with breakfast. Eden Emms, NP Past Week Active   glucose blood (ACCU-CHEK GUIDE) test strip [id number] No Check sugar 2 times daily. DX E11.69  Patient not taking: Reported on 11/13/2022   Eden Emms, NP Not Taking Active   Insulin Pen Needle 31G X 8 MM MISC [id number] Yes Use to inject insulin once daily Eden Emms, NP Past Week Active   LANTUS 100 UNIT/ML injection [id number] Yes Inject 0.3 mLs (30 Units total) into the skin at bedtime. Eden Emms, NP Past Week Active   Vilazodone HCl (VIIBRYD) 10 MG TABS [id number]  Take 1 tablet (10 mg total) by mouth daily with breakfast for 7 days. Jomarie Longs, MD  Expired 07/01/22 2359   Vilazodone HCl (VIIBRYD) 20 MG TABS [id number]  Take 1 tablet (20 mg total) by mouth daily with breakfast.  Patient not taking: Reported on 07/26/2022   Jomarie Longs, MD  Expired 08/30/22 2359             Home Care and Equipment/Supplies: Were Home Health Services Ordered?: NA Any new equipment or medical supplies ordered?: NA  Functional Questionnaire: Do you need assistance with bathing/showering or dressing?: No Do you need assistance with meal preparation?: No  Do you need assistance with eating?: No Do you have difficulty maintaining continence: No Do you need assistance with getting out of bed/getting out of a chair/moving?: No Do you have difficulty managing or taking your medications?: No  Follow up appointments reviewed: PCP Follow-up appointment confirmed?: NA (pt wants to wait before scheduling ED FU. pt is aware how to get in contact with [facility] Specialist Hospital Follow-up appointment confirmed?: NA (pt said he thought [facility] would help schedule appt  for urologist when he comes for appt at [facility].) Do you need transportation to your follow-up appointment?: No Do you understand care options if your condition(s) worsen?: Yes-patient verbalized understanding    SIGNATURE Lewanda Rife, LPN

## 2022-11-17 NOTE — Telephone Encounter (Signed)
Noted  

## 2022-11-26 ENCOUNTER — Other Ambulatory Visit: Payer: Self-pay | Admitting: Nurse Practitioner

## 2022-11-26 DIAGNOSIS — N528 Other male erectile dysfunction: Secondary | ICD-10-CM

## 2022-11-26 DIAGNOSIS — E1165 Type 2 diabetes mellitus with hyperglycemia: Secondary | ICD-10-CM

## 2022-11-26 DIAGNOSIS — K219 Gastro-esophageal reflux disease without esophagitis: Secondary | ICD-10-CM

## 2022-12-03 ENCOUNTER — Inpatient Hospital Stay: Payer: Medicaid Other | Admitting: Nurse Practitioner

## 2022-12-06 ENCOUNTER — Encounter: Payer: Self-pay | Admitting: Nurse Practitioner

## 2023-03-07 ENCOUNTER — Ambulatory Visit: Payer: Medicaid Other | Admitting: Nurse Practitioner

## 2023-03-07 ENCOUNTER — Encounter: Payer: Self-pay | Admitting: Nurse Practitioner

## 2023-03-07 ENCOUNTER — Other Ambulatory Visit: Payer: Self-pay | Admitting: Nurse Practitioner

## 2023-03-07 VITALS — BP 130/80 | HR 96 | Temp 98.8°F | Ht 72.0 in | Wt 180.0 lb

## 2023-03-07 DIAGNOSIS — L0291 Cutaneous abscess, unspecified: Secondary | ICD-10-CM

## 2023-03-07 DIAGNOSIS — E1165 Type 2 diabetes mellitus with hyperglycemia: Secondary | ICD-10-CM

## 2023-03-07 DIAGNOSIS — Z794 Long term (current) use of insulin: Secondary | ICD-10-CM | POA: Diagnosis not present

## 2023-03-07 DIAGNOSIS — M25562 Pain in left knee: Secondary | ICD-10-CM

## 2023-03-07 DIAGNOSIS — K219 Gastro-esophageal reflux disease without esophagitis: Secondary | ICD-10-CM

## 2023-03-07 DIAGNOSIS — M25512 Pain in left shoulder: Secondary | ICD-10-CM

## 2023-03-07 DIAGNOSIS — G8929 Other chronic pain: Secondary | ICD-10-CM

## 2023-03-07 LAB — POCT GLYCOSYLATED HEMOGLOBIN (HGB A1C): Hemoglobin A1C: 11.3 % — AB (ref 4.0–5.6)

## 2023-03-07 MED ORDER — FAMOTIDINE 20 MG PO TABS: 20.0000 mg | ORAL_TABLET | Freq: Two times a day (BID) | ORAL | 0 refills | Status: DC

## 2023-03-07 MED ORDER — SULFAMETHOXAZOLE-TRIMETHOPRIM 800-160 MG PO TABS: 1.0000 | ORAL_TABLET | Freq: Two times a day (BID) | ORAL | 0 refills | Status: DC

## 2023-03-07 MED ORDER — LANTUS 100 UNIT/ML ~~LOC~~ SOLN: 30.0000 [IU] | Freq: Every day | SUBCUTANEOUS | 0 refills | Status: DC

## 2023-03-07 MED ORDER — GLIPIZIDE ER 10 MG PO TB24: 10.0000 mg | ORAL_TABLET | Freq: Every day | ORAL | 0 refills | Status: DC

## 2023-03-07 MED ORDER — METHOCARBAMOL 500 MG PO TABS: 500.0000 mg | ORAL_TABLET | Freq: Two times a day (BID) | ORAL | 0 refills | Status: DC | PRN

## 2023-03-07 NOTE — Assessment & Plan Note (Signed)
Do think mixture of shoulder impingement along with muscle tightness.  Negative rehab exercises along with Robaxin 500 mg twice daily sedation precautions reviewed

## 2023-03-07 NOTE — Assessment & Plan Note (Signed)
Patient is followed by orthopedist and gets injections.  Last 1 6 to 9 months ago.  Patient states they are beneficial encourage patient to follow-up with orthopedist

## 2023-03-07 NOTE — Patient Instructions (Addendum)
Nice to see you today The muscle relaxer's can make you sleepy, use caution Follow up with me in 3 months, sooner if you need me

## 2023-03-07 NOTE — Assessment & Plan Note (Signed)
History of the same abscess versus infected cyst will do Bactrim DS 1 tab twice daily for 7 days.  Follow-up if no improvement

## 2023-03-07 NOTE — Progress Notes (Signed)
Established Patient Office Visit  Subjective   Patient ID: Karl Muhs., male    DOB: [birth year]  Age: 62 y.o. MRN: [id number]  Chief Complaint  Patient presents with   Knee Pain    Pt states he plans to get injection from ortho. Ongoing pain. Dull stabbing pain. Hard to sit down and get comfortable.    Shoulder Pain     Complains of a knot that is hard and soft. Ongoing pain. Has had surgery in early 2000. Complains of sharp pain. Esp when turning neck.      Knee pain: left knee that has been bothering since 1993. No surgery on the left knee. States that he has had injectoin s int eh past 6-9 months. States they do help. States that movement makes it worse. So it hurtt all the time. Movemnet and weather can make it worse. States tha the has ried ibuprofen that does hlep.   Should pain:  states that he has surgery in the past for the bone spur. States that movement and laying down make sit worse. No numbness or weakness tinglig.  DM2: patient is suppose to be on lantus 30 units at night and glipizide 5mg  daily. Checks sugars: states that he will check it not even once a month. Sttes that he is doing 30 units. States that he is out of the glipzide. States that he is having low sugars once-twice a week and food intake.  States that he will eat 2 meals a day  Knot: states that he has had the know on the left hsoulder for the past few months. States that it has grown.States that it feels warm and no leakage     Review of Systems  Constitutional:  Negative for chills and fever.  Respiratory:  Negative for shortness of breath.   Cardiovascular:  Negative for chest pain.  Musculoskeletal:  Positive for joint pain.  Skin:        "+" lesion  Neurological:  Negative for headaches.      Objective:     BP 130/80   Pulse 96   Temp 98.8 F (37.1 C) (Temporal)   Ht 6' (1.829 m)   Wt 180 lb (81.6 kg)   SpO2 96%   BMI 24.41 kg/m    Physical Exam Vitals and nursing note  reviewed.  Constitutional:      Appearance: Normal appearance.  Cardiovascular:     Rate and Rhythm: Normal rate and regular rhythm.     Pulses:          Dorsalis pedis pulses are 2+ on the right side and 2+ on the left side.       Posterior tibial pulses are 2+ on the right side and 2+ on the left side.     Heart sounds: Normal heart sounds.  Pulmonary:     Effort: Pulmonary effort is normal.     Breath sounds: Normal breath sounds.  Musculoskeletal:       Arms:     Right lower leg: No edema.     Left lower leg: No edema.     Comments: TTP  "+" empty can test   Feet:     Right foot:     Toenail Condition: Right toenails are abnormally thick. Fungal disease present.    Left foot:     Toenail Condition: Left toenails are abnormally thick. Fungal disease present. Skin:      Neurological:     Mental Status: He is  alert.      Results for orders placed or performed in visit on 03/07/23  POCT glycosylated hemoglobin (Hb A1C)  Result Value Ref Range   Hemoglobin A1C 11.3 (A) 4.0 - 5.6 %   HbA1c POC (<> result, manual entry)     HbA1c, POC (prediabetic range)     HbA1c, POC (controlled diabetic range)        The 10-year ASCVD risk score (Arnett DK, et al., 2019) is: 22%    Assessment & Plan:   Problem List Items Addressed This Visit       Digestive   GERD (gastroesophageal reflux disease)    Refill famotidine      Relevant Medications   famotidine (PEPCID) 20 MG tablet     Endocrine   Type 2 diabetes mellitus with hyperglycemia, with long-term current use of insulin (HCC) - Primary   Relevant Medications   glipiZIDE (GLUCOTROL XL) 10 MG 24 hr tablet   LANTUS 100 UNIT/ML injection   Other Relevant Orders   POCT glycosylated hemoglobin (Hb A1C) (Completed)     Other   Chronic pain of left knee    Patient is followed by orthopedist and gets injections.  Last 1 6 to 9 months ago.  Patient states they are beneficial encourage patient to follow-up with  orthopedist      Relevant Medications   methocarbamol (ROBAXIN) 500 MG tablet   Abscess    History of the same abscess versus infected cyst will do Bactrim DS 1 tab twice daily for 7 days.  Follow-up if no improvement      Relevant Medications   sulfamethoxazole-trimethoprim (BACTRIM DS) 800-160 MG tablet   Chronic left shoulder pain    Do think mixture of shoulder impingement along with muscle tightness.  Negative rehab exercises along with Robaxin 500 mg twice daily sedation precautions reviewed      Relevant Medications   methocarbamol (ROBAXIN) 500 MG tablet    Return in about 3 months (around 06/06/2023) for DM recheck.    Audria Nine, NP

## 2023-03-07 NOTE — Assessment & Plan Note (Signed)
Refill famotidine.

## 2023-03-08 ENCOUNTER — Other Ambulatory Visit: Payer: Self-pay | Admitting: Nurse Practitioner

## 2023-03-08 NOTE — Telephone Encounter (Signed)
Got notice from the pharmacy that lantus was not covered. Can we call and see if they have an alternative that is covered please

## 2023-03-08 NOTE — Telephone Encounter (Signed)
Contacted pharmacy. Pharmacy staff stated that the pts medicaid insurance will allow coverage for the generic prescription not brand name. DW Zero.

## 2023-03-14 MED ORDER — INSULIN GLARGINE 100 UNIT/ML ~~LOC~~ SOLN: 30.0000 [IU] | Freq: Every day | SUBCUTANEOUS | 0 refills | Status: DC

## 2023-04-09 ENCOUNTER — Other Ambulatory Visit: Payer: Self-pay | Admitting: Nurse Practitioner

## 2023-04-09 DIAGNOSIS — E1165 Type 2 diabetes mellitus with hyperglycemia: Secondary | ICD-10-CM

## 2023-05-06 ENCOUNTER — Other Ambulatory Visit: Payer: Self-pay | Admitting: Nurse Practitioner

## 2023-05-06 DIAGNOSIS — E1165 Type 2 diabetes mellitus with hyperglycemia: Secondary | ICD-10-CM

## 2023-06-06 ENCOUNTER — Ambulatory Visit: Payer: Medicaid Other | Admitting: Nurse Practitioner

## 2023-06-09 ENCOUNTER — Encounter: Payer: Self-pay | Admitting: Nurse Practitioner

## 2023-06-10 ENCOUNTER — Encounter: Payer: Self-pay | Admitting: Nurse Practitioner

## 2023-06-10 ENCOUNTER — Telehealth: Payer: Self-pay | Admitting: Nurse Practitioner

## 2023-06-10 ENCOUNTER — Ambulatory Visit (INDEPENDENT_AMBULATORY_CARE_PROVIDER_SITE_OTHER): Payer: Medicaid Other | Admitting: Nurse Practitioner

## 2023-06-10 VITALS — BP 160/100 | HR 62 | Temp 97.8°F | Ht 72.0 in | Wt 184.2 lb

## 2023-06-10 DIAGNOSIS — M25562 Pain in left knee: Secondary | ICD-10-CM

## 2023-06-10 DIAGNOSIS — E1159 Type 2 diabetes mellitus with other circulatory complications: Secondary | ICD-10-CM

## 2023-06-10 DIAGNOSIS — E1165 Type 2 diabetes mellitus with hyperglycemia: Secondary | ICD-10-CM

## 2023-06-10 DIAGNOSIS — I152 Hypertension secondary to endocrine disorders: Secondary | ICD-10-CM | POA: Diagnosis not present

## 2023-06-10 DIAGNOSIS — M5440 Lumbago with sciatica, unspecified side: Secondary | ICD-10-CM | POA: Diagnosis not present

## 2023-06-10 DIAGNOSIS — Z720 Tobacco use: Secondary | ICD-10-CM

## 2023-06-10 DIAGNOSIS — Z794 Long term (current) use of insulin: Secondary | ICD-10-CM

## 2023-06-10 DIAGNOSIS — R45851 Suicidal ideations: Secondary | ICD-10-CM | POA: Insufficient documentation

## 2023-06-10 DIAGNOSIS — Z59819 Housing instability, housed unspecified: Secondary | ICD-10-CM | POA: Insufficient documentation

## 2023-06-10 DIAGNOSIS — G8929 Other chronic pain: Secondary | ICD-10-CM

## 2023-06-10 HISTORY — DX: Suicidal ideations: R45.851

## 2023-06-10 LAB — COMPREHENSIVE METABOLIC PANEL
ALT: 7 U/L (ref 0–53)
AST: 10 U/L (ref 0–37)
Albumin: 3.9 g/dL (ref 3.5–5.2)
Alkaline Phosphatase: 114 U/L (ref 39–117)
BUN: 11 mg/dL (ref 6–23)
CO2: 29 meq/L (ref 19–32)
Calcium: 9.9 mg/dL (ref 8.4–10.5)
Chloride: 96 meq/L (ref 96–112)
Creatinine, Ser: 0.99 mg/dL (ref 0.40–1.50)
GFR: 83.1 mL/min (ref >60.00)
Glucose, Bld: 318 mg/dL — ABNORMAL HIGH (ref 70–99)
Potassium: 4.4 meq/L (ref 3.5–5.1)
Sodium: 132 meq/L — ABNORMAL LOW (ref 135–145)
Total Bilirubin: 0.5 mg/dL (ref 0.2–1.2)
Total Protein: 7.4 g/dL (ref 6.0–8.3)

## 2023-06-10 LAB — LIPID PANEL
Cholesterol: 140 mg/dL (ref 0–200)
HDL: 38.9 mg/dL — ABNORMAL LOW (ref >39.00)
LDL Cholesterol: 83 mg/dL (ref 0–99)
NonHDL: 100.81
Total CHOL/HDL Ratio: 4
Triglycerides: 90 mg/dL (ref 0.0–149.0)
VLDL: 18 mg/dL (ref 0.0–40.0)

## 2023-06-10 LAB — MICROALBUMIN / CREATININE URINE RATIO
Creatinine,U: 126.6 mg/dL
Microalb Creat Ratio: 2.9 mg/g (ref 0.0–30.0)
Microalb, Ur: 3.7 mg/dL — ABNORMAL HIGH (ref 0.0–1.9)

## 2023-06-10 LAB — URINALYSIS, MICROSCOPIC ONLY

## 2023-06-10 LAB — CBC
HCT: 39.3 % (ref 39.0–52.0)
Hemoglobin: 13.1 g/dL (ref 13.0–17.0)
MCHC: 33.4 g/dL (ref 30.0–36.0)
MCV: 87.4 fL (ref 78.0–100.0)
Platelets: 410 10*3/uL — ABNORMAL HIGH (ref 150.0–400.0)
RBC: 4.5 Mil/uL (ref 4.22–5.81)
RDW: 13.7 % (ref 11.5–15.5)
WBC: 12.7 10*3/uL — ABNORMAL HIGH (ref 4.0–10.5)

## 2023-06-10 LAB — POCT GLYCOSYLATED HEMOGLOBIN (HGB A1C): Hemoglobin A1C: 11.1 % — AB (ref 4.0–5.6)

## 2023-06-10 NOTE — Progress Notes (Signed)
Established Patient Office Visit  Subjective   Patient ID: Karl Weaver., male    DOB: 06-17-1960  Age: 62 y.o. MRN: [id number]  Chief Complaint  Patient presents with   Diabetes      DM2: patient is currently maintatined on glipizide 10mg  dialy and lantus 30 units at bedtime. Previous A1C was 11.3. todays A1C was 11.1. States at home he has not been checking his sugars. States that he is currently couch surfing and staying with friends   HTN: has had a history of uncontrolled blood pressure in the past. As of late he has been managing on lifestyle modifications soley.  Patient states blood pressure can be elevated today as he is in pain from his orthopedic injuries/problems.   Depressoin/PTSD: states that he has had thoughts of harming. States that he has had a plan. States that he has thought about itna does have a plan and it is a gun. Patient does have access to a gun he states    Review of Systems  Constitutional:  Negative for chills and fever.  Respiratory:  Negative for shortness of breath.   Cardiovascular:  Negative for chest pain.  Gastrointestinal:  Negative for abdominal pain, diarrhea, nausea and vomiting.  Neurological:  Negative for headaches.  Psychiatric/Behavioral:  Positive for substance abuse and suicidal ideas. Negative for hallucinations.       Objective:     BP (!) 160/100   Pulse 62   Temp 97.8 F (36.6 C) (Oral)   Ht 6' (1.829 m)   Wt 184 lb 3.2 oz (83.6 kg)   SpO2 98%   BMI 24.98 kg/m  BP Readings from Last 3 Encounters:  06/10/23 (!) 160/100  03/07/23 130/80  11/14/22 113/65   Wt Readings from Last 3 Encounters:  06/10/23 184 lb 3.2 oz (83.6 kg)  03/07/23 180 lb (81.6 kg)  11/13/22 188 lb (85.3 kg)   SpO2 Readings from Last 3 Encounters:  06/10/23 98%  03/07/23 96%  11/14/22 99%      Physical Exam Vitals and nursing note reviewed.  Constitutional:      Appearance: Normal appearance.  Cardiovascular:     Rate and  Rhythm: Normal rate and regular rhythm.     Pulses: Normal pulses.     Heart sounds: Normal heart sounds.  Pulmonary:     Effort: Pulmonary effort is normal.     Breath sounds: Normal breath sounds.  Musculoskeletal:     Right lower leg: No edema.     Left lower leg: No edema.  Neurological:     Mental Status: He is alert.      Results for orders placed or performed in visit on 06/10/23  POCT glycosylated hemoglobin (Hb A1C)  Result Value Ref Range   Hemoglobin A1C 11.1 (A) 4.0 - 5.6 %   HbA1c POC (<> result, manual entry)     HbA1c, POC (prediabetic range)     HbA1c, POC (controlled diabetic range)        The 10-year ASCVD risk score (Arnett DK, et al., 2019) is: 30.2%    Assessment & Plan:   Problem List Items Addressed This Visit       Cardiovascular and Mediastinum   Hypertension associated with diabetes (HCC)   Patient has been on medication in the past.  Most recent blood pressures have been within normal limits elevated today secondary to pain per patient report.  We will continue to monitor at this juncture patient is having financial difficulties  with medication food and housing currently      Relevant Orders   CBC   Comprehensive metabolic panel   Lipid panel     Endocrine   Type 2 diabetes mellitus with hyperglycemia, with long-term current use of insulin (HCC) - Primary   Patient currently maintained on 30 units of Lantus along with glipizide 10 mg XL.  Patient is unable to afford glipizide he is using Lantus per his report.  Ambulatory furl to pharmacy for medication assistance along with social work.  Will continue medications as is.  Thank about titrating medication next office visit if he is on glipizide 10 XL daily      Relevant Orders   POCT glycosylated hemoglobin (Hb A1C) (Completed)   CBC   Comprehensive metabolic panel   Microalbumin / creatinine urine ratio   Lipid panel     Other   Chronic low back pain   History of the same.  Patient  states he uses THC for pain relief      Chronic pain of left knee   History of the same.  Patient uses THC for pain management      Housing insecurity   Ambulatory referral to chronic care management.  Patient needs  services in all disciplines      Relevant Orders   AMB Referral VBCI Care Management   Tobacco abuse   Relevant Orders   Urine Microscopic   Passive suicidal ideations   Patient has a history of suicidal ideations in the past with plan.  Patient is having these due to environmental stressors we discussed that if patient started having thoughts wanting to hurt himself or others to be seen or evaluated immediately.  Patient agreed he also has the veterans crisis hotline number that he should manage while out.  Patient was followed by psychiatry in the past will reach out to see if it was patient proven by provider driven that he has not been followed up with yet       Return in about 3 months (around 09/08/2023) for DM recheck.    Audria Nine, NP

## 2023-06-10 NOTE — Assessment & Plan Note (Signed)
History of the same.  Patient uses THC for pain management

## 2023-06-10 NOTE — Telephone Encounter (Signed)
Can we reach back out to [facility] psych and see if patient is eligible to continue being seen through them

## 2023-06-10 NOTE — Patient Instructions (Signed)
Nice to see you today Several folks will be reaching out to your for resource help Follow up with me in 3 months, sooner if you need me

## 2023-06-10 NOTE — Telephone Encounter (Signed)
Contacted [place] office. Left voicemail for office to call back.

## 2023-06-10 NOTE — Telephone Encounter (Signed)
Can we call [place] office and see if they can schedule an appointment with the patient please

## 2023-06-10 NOTE — Assessment & Plan Note (Signed)
Patient currently maintained on 30 units of Lantus along with glipizide 10 mg XL.  Patient is unable to afford glipizide he is using Lantus per his report.  Ambulatory furl to pharmacy for medication assistance along with social work.  Will continue medications as is.  Thank about titrating medication next office visit if he is on glipizide 10 XL daily

## 2023-06-10 NOTE — Telephone Encounter (Signed)
Contacted [facility] [employer]. Caller stated that the pt is not being seen at their office but is a returning patient with [facility].  Caller also stated that they will ask for release of information before verifying if he is a patient at [facility] clinic.   [place] clinic phone number: [phone]  please advise

## 2023-06-10 NOTE — Assessment & Plan Note (Signed)
Patient has been on medication in the past.  Most recent blood pressures have been within normal limits elevated today secondary to pain per patient report.  We will continue to monitor at this juncture patient is having financial difficulties with medication food and housing currently

## 2023-06-10 NOTE — Assessment & Plan Note (Signed)
History of the same.  Patient states he uses THC for pain relief

## 2023-06-10 NOTE — Assessment & Plan Note (Signed)
Ambulatory referral to chronic care management.  Patient needs  services in all disciplines

## 2023-06-10 NOTE — Assessment & Plan Note (Signed)
Patient has a history of suicidal ideations in the past with plan.  Patient is having these due to environmental stressors we discussed that if patient started having thoughts wanting to hurt himself or others to be seen or evaluated immediately.  Patient agreed he also has the veterans crisis hotline number that he should manage while out.  Patient was followed by psychiatry in the past will reach out to see if it was patient proven by provider driven that he has not been followed up with yet

## 2023-06-13 NOTE — Telephone Encounter (Signed)
Contacted [place] office.   Office staff stated that the pt has not been seen since 6/23. Pt will have to re-establish as a new patient.

## 2023-06-13 NOTE — Telephone Encounter (Signed)
Can we contact patient and see if he wants to go there or somewhere closer? Then I will place the referral

## 2023-06-14 ENCOUNTER — Telehealth: Payer: Self-pay | Admitting: Nurse Practitioner

## 2023-06-14 NOTE — Telephone Encounter (Signed)
Called patient has appointment set up for next month with [facility]. He would like to hold off on any referrals until seen by them.

## 2023-06-14 NOTE — Telephone Encounter (Signed)
Noted  

## 2023-06-14 NOTE — Telephone Encounter (Signed)
-----   Message from [facility] T sent at 06/14/2023 10:49 AM EST ----- Called patient reviewed labs. Sates that his cough has increased from last visit. Patient has not taken anything over the counter at this time. Wanted to know if you recommended anything.

## 2023-06-14 NOTE — Telephone Encounter (Signed)
He can use delsym over the counter. But we did not discuss a cough in office

## 2023-06-14 NOTE — Telephone Encounter (Signed)
Contacted pt.   Advised to pt that he can take OTC delsym to help with cough. No further questions or concerns.

## 2023-06-20 ENCOUNTER — Other Ambulatory Visit: Payer: Self-pay | Admitting: *Deleted

## 2023-06-20 NOTE — Patient Instructions (Signed)
 Visit Information  Karl Weaver was given information about Medicaid Managed Care team care coordination services as a part of their Healthy [facility] Medicaid benefit. Karl Weaver. verbally consented to engagement with the [facility] Managed Care team.   If you are experiencing a medical emergency, please call 911 or report to your local emergency department or urgent care.   If you have a non-emergency medical problem during routine business hours, please contact your provider's office and ask to speak with a nurse.   For questions related to your Healthy [facility] health plan, please call: [phone] or visit the homepage here: [email or web address]  If you would like to schedule transportation through your Healthy [facility] plan, please call the following number at least 2 days in advance of your appointment: [phone]  For information about your ride after you set it up, call Ride Assist at [phone]. Use this number to activate a Will Call pickup, or if your transportation is late for a scheduled pickup. Use this number, too, if you need to make a change or cancel a previously scheduled reservation.  If you need transportation services right away, call [phone]. The after-hours call center is staffed 24 hours to handle ride assistance and urgent reservation requests (including discharges) 365 days a year. Urgent trips include sick visits, hospital discharge requests and life-sustaining treatment.  Call the [facility] Line at [phone], at any time, 24 hours a day, 7 days a week. If you are in danger or need immediate medical attention call 911.  If you would like help to quit smoking, call 1-800-QUIT-NOW (6810479760) OR Espaol: 1-855-Djelo-Ya (8-144-664-6430) o para ms informacin haga clic aqu or Text READY to 799-599 to register via text  Karl Weaver,   Please see education materials related to diabetes  provided by MyChart link. 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   Patient verbalizes understanding of instructions and care plan provided today and agrees to view in MyChart. Active MyChart status and patient understanding of how to access instructions and care plan via MyChart confirmed with patient.     Telephone follow up appointment with Managed Medicaid care management team member scheduled for:07/20/23 at 9am  Andrea Dimes RN, BSN [place]  [facility] RN Care Coordinator [phone]   Following is a copy of your plan of care:  Care Plan : RN Care Manager Plan of Care  Updates made by Dimes Andrea LABOR, RN since 06/20/2023 12:00 AM     Problem: Health Managment needs related to DMII      Long-Range Goal: Development of Plan of Care to address Health Managment needs related to DMII   Start Date: 06/20/2023  Expected End Date: 09/18/2023  Note:   Current Barriers:  Chronic Disease Management support and education needs related to DMII   RNCM Clinical Goal(s):  Patient will verbalize understanding of plan for management of DMII as evidenced by patient reports take all medications exactly as prescribed and will call provider for medication related questions as evidenced by patient reports attend all scheduled medical appointments: 06/24/23 with BSW and 07/06/23 with PCP as evidenced by provider documentation in EMR work with social worker to address  related to the management of Limited access to food, Housing barriers, and Medication procurement related to the management of DMII as evidenced by review of EMR and patient or social worker report through collaboration with Medical Illustrator, provider, and care team.   Interventions: Evaluation of current treatment plan related to  self  management and patient's adherence to plan as established by provider   Diabetes Interventions:  (Status:  New goal.) Long Term Goal Assessed patient's understanding of A1c goal:  <7% Provided education to patient about basic DM disease process Reviewed medications with patient and discussed importance of medication adherence Discussed plans with patient for ongoing care management follow up and provided patient with direct contact information for care management team Reviewed scheduled/upcoming provider appointments including: BSW on 06/24/23 and New PCP on 07/06/23 Referral made to social work team for assistance with scheduled with LCSW on 06/27/23 for assistance with Mental Health Review of patient status, including review of consultants reports, relevant laboratory and other test results, and medications completed Assessed social determinant of health barriers Reviewed member benefits provided by [facility], provided member services line [phone] Provided patient with [employer] medical transportation [phone] Discussed having prescriptions transferred to [facility] [phone] and requesting a charge account Lab Results  Component Value Date   HGBA1C 11.1 (A) 06/10/2023    Patient Goals/Self-Care Activities: Take all medications as prescribed Call provider office for new concerns or questions  Work with the social worker to address care coordination needs and will continue to work with the clinical team to address health care and disease management related needs  Follow Up Plan:  Telephone follow up appointment with care management team member scheduled for:  07/20/23 at 9am

## 2023-06-20 NOTE — Patient Outreach (Signed)
 Medicaid Managed Care   Nurse Care Manager Note  06/20/2023 Name:  Karl Weaver. MRN:  [id number] DOB:  1961-05-29  Karl Weaver. is an 63 y.o. year old male who is a primary patient of Wendee Lynwood HERO, NP.  The [facility] Managed Care Coordination team was consulted for assistance with:    DMII  Mr. Delone was given information about Medicaid Managed Care Coordination team services today. Karl Weaver. Patient agreed to services and verbal consent obtained.  Engaged with patient by telephone for initial visit in response to provider referral for case management and/or care coordination services.   Patient is participating in a Managed Medicaid Plan:  Yes  Assessments/Interventions:  Review of past medical history, allergies, medications, health status, including review of consultants reports, laboratory and other test data, was performed as part of comprehensive evaluation and provision of chronic care management services.  SDOH (Social Drivers of Health) assessments and interventions performed: SDOH Interventions    Flowsheet Row Patient Outreach Telephone from 06/20/2023 in [place]PART[facility] Office Visit from 06/10/2023 in [facility] [place]  SDOH Interventions    Food Insecurity Interventions Other (Comment)  [BSW referral] --  Housing Interventions Other (Comment)  [BSW referral-scheduled on 06/24/23] --  Transportation Interventions Intervention Not Indicated --  Utilities Interventions Other (Comment)  [BSW referral] --  Depression Interventions/Treatment  -- Referral to Psychiatry       Care Plan  Allergies  Allergen Reactions   Pork Allergy Anaphylaxis   Codeine Itching   Cyclobenzaprine  Other (See Comments)    Other reaction(s): Other (See Comments) Increased blood sugar and irritability  Increased blood sugar and irritability     Diclofenac Sodium Other (See Comments)    Other  reaction(s): Other (See Comments) Gi gas  Gi gas     Esomeprazole Magnesium Other (See Comments)    Other reaction(s): Other (See Comments) Increased acid  Increased acid     Nsaids Other (See Comments)    Increased blood sugar   Amitriptyline Rash    Other reaction(s): Other (See Comments) jittery jittery     Medications Reviewed Today     Reviewed by Lucky Andrea LABOR, RN (Registered Nurse) on 06/20/23 at 1319  Med List Status: <None>   Medication Order Taking? Sig Documenting Provider Last Dose Status Informant  beclomethasone (QVAR) 40 MCG/ACT inhaler [id number] No Inhale 2 puffs into the lungs 2 (two) times daily.  Patient not taking: Reported on 06/10/2023   [provider] Not Taking Active Self  famotidine  (PEPCID ) 20 MG tablet [id number] Yes Take 1 tablet (20 mg total) by mouth 2 (two) times daily. Wendee Lynwood HERO, NP Taking Active   glipiZIDE  (GLUCOTROL  XL) 10 MG 24 hr tablet [id number] No Take 1 tablet (10 mg total) by mouth daily with breakfast.  Patient not taking: Reported on 06/20/2023   Wendee Lynwood HERO, NP Not Taking Active   glucose blood (ACCU-CHEK GUIDE) test strip [id number] No Check sugar 2 times daily. DX E11.69  Patient not taking: Reported on 11/13/2022   Wendee Lynwood HERO, NP Not Taking Active   insulin  glargine-yfgn (SEMGLEE ) 100 UNIT/ML injection [id number] Yes INJECT 0.3 MLS (30 UNITS TOTAL) INTO THE SKIN AT BEDTIME. Wendee Lynwood HERO, NP Taking Active   Insulin  Pen Needle 31G X 8 MM MISC [id number] Yes Use to inject insulin  once daily Wendee Lynwood HERO, NP Taking Active  Med Note Karl Weaver [place] Mar 07, 2023 10:49 AM) Needs refill  methocarbamol  (ROBAXIN ) 500 MG tablet [id number] No Take 1 tablet (500 mg total) by mouth 2 (two) times daily as needed for muscle spasms.  Patient not taking: Reported on 06/20/2023   Wendee Lynwood HERO, NP Not Taking Active   Vilazodone  HCl (VIIBRYD ) 10 MG TABS [id number]  Take 1 tablet (10 mg total) by mouth daily with  breakfast for 7 days. Eappen, Saramma, MD  Expired 07/01/22 2359   Vilazodone  HCl (VIIBRYD ) 20 MG TABS [id number]  Take 1 tablet (20 mg total) by mouth daily with breakfast.  Patient not taking: Reported on 07/26/2022   Eappen, Saramma, MD  Expired 08/30/22 2359             Patient Active Problem List   Diagnosis Date Noted   Housing insecurity 06/10/2023   Tobacco abuse 06/10/2023   Passive suicidal ideations 06/10/2023   Chronic left shoulder pain 03/07/2023   Abscess 07/26/2022   High risk medication use 06/24/2022   Tachycardia 06/24/2022   Opioid use disorder, moderate, in sustained remission (HCC) 06/24/2022   Cocaine use disorder, moderate, in sustained remission (HCC) 06/24/2022   Alcohol use disorder, moderate, in sustained remission (HCC) 06/24/2022   Current moderate episode of major depressive disorder (HCC) 04/23/2022   Encounter for screening colonoscopy    Unintentional weight loss 01/21/2022   PTSD (post-traumatic stress disorder) 12/29/2020   Erectile dysfunction 01/04/2020   Chronic pain of left knee 08/27/2014   OSA on CPAP 08/27/2014   Seasonal allergies 08/27/2014   Chronic low back pain 03/29/2014   COPD (chronic obstructive pulmonary disease) (HCC) 03/29/2014   Hyperlipidemia 03/29/2014   Type 2 diabetes mellitus with hyperglycemia, with long-term current use of insulin  (HCC) 03/20/2014   GERD (gastroesophageal reflux disease) 03/20/2014   Hypertension associated with diabetes (HCC) 03/20/2014    Conditions to be addressed/monitored per PCP order:  DMII  Care Plan : RN Care Manager Plan of Care  Updates made by Lucky Andrea LABOR, RN since 06/20/2023 12:00 AM     Problem: Health Managment needs related to DMII      Long-Range Goal: Development of Plan of Care to address Health Managment needs related to DMII   Start Date: 06/20/2023  Expected End Date: 09/18/2023  Note:   Current Barriers:  Chronic Disease Management support and education needs related  to DMII   RNCM Clinical Goal(s):  Patient will verbalize understanding of plan for management of DMII as evidenced by patient reports take all medications exactly as prescribed and will call provider for medication related questions as evidenced by patient reports attend all scheduled medical appointments: 06/24/23 with BSW and 07/06/23 with PCP as evidenced by provider documentation in EMR work with child psychotherapist to address  related to the management of Limited access to food, Housing barriers, and Medication procurement related to the management of DMII as evidenced by review of EMR and patient or child psychotherapist report through collaboration with Medical Illustrator, provider, and care team.   Interventions: Evaluation of current treatment plan related to  self management and patient's adherence to plan as established by provider   Diabetes Interventions:  (Status:  New goal.) Long Term Goal Assessed patient's understanding of A1c goal: <7% Provided education to patient about basic DM disease process Reviewed medications with patient and discussed importance of medication adherence Discussed plans with patient for ongoing care management follow up and provided patient with direct contact information  for care management team Reviewed scheduled/upcoming provider appointments including: BSW on 06/24/23 and New PCP on 07/06/23 Referral made to social work team for assistance with scheduled with LCSW on 06/27/23 for assistance with Mental Health Review of patient status, including review of consultants reports, relevant laboratory and other test results, and medications completed Assessed social determinant of health barriers Reviewed member benefits provided by [facility], provided member services line [phone] Provided patient with [employer] medical transportation [phone] Discussed having prescriptions transferred to [facility] [phone] and requesting a  charge account Lab Results  Component Value Date   HGBA1C 11.1 (A) 06/10/2023    Patient Goals/Self-Care Activities: Take all medications as prescribed Call provider office for new concerns or questions  Work with the social worker to address care coordination needs and will continue to work with the clinical team to address health care and disease management related needs  Follow Up Plan:  Telephone follow up appointment with care management team member scheduled for:  07/20/23 at 9am      Follow Up:  Patient agrees to Care Plan and Follow-up.  Plan: The Managed Medicaid care management team will reach out to the patient again over the next 30 days.  Date/time of next scheduled RN care management/care coordination outreach:  07/20/23 at 9am  Andrea Dimes RN, BSN [place]  [facility] RN Care Coordinator [phone]

## 2023-06-24 ENCOUNTER — Other Ambulatory Visit: Payer: Self-pay

## 2023-06-24 NOTE — Patient Instructions (Signed)
 Visit Information  [name] was given information about Medicaid Managed Care team care coordination services as a part of their Healthy [facility] Medicaid benefit. Karl Weaver. verbally consented to engagement with the [facility] Managed Care team.   If you are experiencing a medical emergency, please call 911 or report to your local emergency department or urgent care.   If you have a non-emergency medical problem during routine business hours, please contact your provider's office and ask to speak with a nurse.   For questions related to your Healthy [facility] health plan, please call: [phone] or visit the homepage here: [email or web address]  If you would like to schedule transportation through your Healthy [facility] plan, please call the following number at least 2 days in advance of your appointment: [phone]  For information about your ride after you set it up, call Ride Assist at [phone]. Use this number to activate a Will Call pickup, or if your transportation is late for a scheduled pickup. Use this number, too, if you need to make a change or cancel a previously scheduled reservation.  If you need transportation services right away, call [phone]. The after-hours call center is staffed 24 hours to handle ride assistance and urgent reservation requests (including discharges) 365 days a year. Urgent trips include sick visits, hospital discharge requests and life-sustaining treatment.  Call the [facility] Line at [phone], at any time, 24 hours a day, 7 days a week. If you are in danger or need immediate medical attention call 911.  If you would like help to quit smoking, call 1-800-QUIT-NOW (415-338-2908) OR Espaol: 1-855-Djelo-Ya (8-756-433-2951) o para ms informacin haga clic aqu or Text READY to 884-166 to register via text  [name] - following are the goals we discussed in your visit today:    Goals Addressed   None      Social Worker will follow up in 30 days.   Karl Weaver, Karl Weaver, MHA [facility]  Managed Medicaid Social Worker [phone]   Following is a copy of your plan of care:  There are no care plans that you recently modified to display for this patient.

## 2023-06-24 NOTE — Patient Outreach (Signed)
 Medicaid Managed Care Social Work Note  06/24/2023 Name:  Karl Weaver. MRN:  [id number] DOB:  10-08-1960  Karl Weaver. is an 63 y.o. year old male who is a primary patient of Wendee Lynwood HERO, NP.  The [facility] Managed Care Coordination team was consulted for assistance with:   housing  Karl Weaver was given information about Medicaid Managed Care Coordination team services today. Karl Weaver. Patient agreed to services and verbal consent obtained.  Engaged with patient  for by telephone forinitial visit in response to referral for case management and/or care coordination services.   Patient is participating in a Managed Medicaid Plan:  Yes  Assessments/Interventions:  Review of past medical history, allergies, medications, health status, including review of consultants reports, laboratory and other test data, was performed as part of comprehensive evaluation and provision of chronic care management services.  SDOH: (Social Drivers of Health) assessments and interventions performed: SDOH Interventions    Flowsheet Row Patient Outreach Telephone from 06/20/2023 in [place] HE[facility] POPULATION HEALTH DEPARTMENT Office Visit from 06/10/2023 in [facility] [place]  SDOH Interventions    Food Insecurity Interventions Other (Comment)  [BSW referral] --  Housing Interventions Other (Comment)  [BSW referral-scheduled on 06/24/23] --  Transportation Interventions Intervention Not Indicated --  Utilities Interventions Other (Comment)  [BSW referral] --  Depression Interventions/Treatment  -- Referral to Psychiatry      BSW completed a telephone outreach with patient, he states he is homeless and living in his fleeta and bouncing around to friends houses. Patient states he receives disability but half is going to his federal-mogul and insurance. Patient does receive foodstamps. Patient would like housing resources for [facility] Advanced  Directives Status:  Not addressed in this encounter.  Care Plan                 Allergies  Allergen Reactions   Pork Allergy Anaphylaxis   Codeine Itching   Cyclobenzaprine  Other (See Comments)    Other reaction(s): Other (See Comments) Increased blood sugar and irritability  Increased blood sugar and irritability     Diclofenac Sodium Other (See Comments)    Other reaction(s): Other (See Comments) Gi gas  Gi gas     Esomeprazole Magnesium Other (See Comments)    Other reaction(s): Other (See Comments) Increased acid  Increased acid     Nsaids Other (See Comments)    Increased blood sugar   Amitriptyline Rash    Other reaction(s): Other (See Comments) jittery jittery     Medications Reviewed Today   Medications were not reviewed in this encounter     Patient Active Problem List   Diagnosis Date Noted   Housing insecurity 06/10/2023   Tobacco abuse 06/10/2023   Passive suicidal ideations 06/10/2023   Chronic left shoulder pain 03/07/2023   Abscess 07/26/2022   High risk medication use 06/24/2022   Tachycardia 06/24/2022   Opioid use disorder, moderate, in sustained remission (HCC) 06/24/2022   Cocaine use disorder, moderate, in sustained remission (HCC) 06/24/2022   Alcohol use disorder, moderate, in sustained remission (HCC) 06/24/2022   Current moderate episode of major depressive disorder (HCC) 04/23/2022   Encounter for screening colonoscopy    Unintentional weight loss 01/21/2022   PTSD (post-traumatic stress disorder) 12/29/2020   Erectile dysfunction 01/04/2020   Chronic pain of left knee 08/27/2014   OSA on CPAP 08/27/2014   Seasonal allergies 08/27/2014   Chronic low back pain 03/29/2014  COPD (chronic obstructive pulmonary disease) (HCC) 03/29/2014   Hyperlipidemia 03/29/2014   Type 2 diabetes mellitus with hyperglycemia, with long-term current use of insulin  (HCC) 03/20/2014   GERD (gastroesophageal reflux disease) 03/20/2014   Hypertension  associated with diabetes (HCC) 03/20/2014    Conditions to be addressed/monitored per PCP order:   housing resources  There are no care plans that you recently modified to display for this patient.   Follow up:  Patient agrees to Care Plan and Follow-up.  Plan: The Managed Medicaid care management team will reach out to the patient again over the next 30 days.  Date/time of next scheduled Social Work care management/care coordination outreach:  07/26/23  Thersia Hoar, HEDWIG, [facility] Health  Managed [facility] Social Worker [phone]

## 2023-06-28 ENCOUNTER — Other Ambulatory Visit: Payer: Self-pay | Admitting: Licensed Clinical Social Worker

## 2023-06-28 DIAGNOSIS — F4310 Post-traumatic stress disorder, unspecified: Secondary | ICD-10-CM

## 2023-06-28 DIAGNOSIS — R45851 Suicidal ideations: Secondary | ICD-10-CM

## 2023-06-28 NOTE — Patient Outreach (Signed)
 Medicaid Managed Care Social Work Note  06/28/2023 Name:  Karl Weaver. MRN:  [id number] DOB:  1960/12/05  Karl Weaver. is an 63 y.o. year old male who is a primary patient of Wendee Lynwood HERO, NP.  The Medicaid Managed Care Coordination team was consulted for assistance with:  Mental Health Counseling and Resources  Mr. Erker was given information about Medicaid Managed Care Coordination team services today. Karl Weaver. Patient agreed to services and verbal consent obtained.  Engaged with patient  for by telephone forinitial visit in response to referral for case management and/or care coordination services.   Patient is participating in a Managed Medicaid Plan:  Yes  Assessments/Interventions:  Review of past medical history, allergies, medications, health status, including review of consultants reports, laboratory and other test data, was performed as part of comprehensive evaluation and provision of chronic care management services.  SDOH: (Social Drivers of Health) assessments and interventions performed: SDOH Interventions    Flowsheet Row Patient Outreach Telephone from 06/28/2023 in [place] POPULATION HEALTH DEPARTMENT Patient Outreach Telephone from 06/20/2023 in [place] [facility] POPULATION HEALTH DEPARTMENT Office Visit from 06/10/2023 in [facility] [place]  SDOH Interventions     Food Insecurity Interventions -- Other (Comment)  [BSW referral] --  Housing Interventions Community Resources Provided Other (Comment)  [BSW referral-scheduled on 06/24/23] --  Transportation Interventions -- Intervention Not Indicated --  Utilities Interventions -- Other (Comment)  [BSW referral] --  Depression Interventions/Treatment  -- -- Referral to Psychiatry  Financial Strain Interventions [employer] Provided -- --  Stress Interventions Provide Counseling, [employer] Provided -- --       Advanced Directives Status:   See Care Plan for related entries.  Care Plan                 Allergies  Allergen Reactions   Pork Allergy Anaphylaxis   Codeine Itching   Cyclobenzaprine  Other (See Comments)    Other reaction(s): Other (See Comments) Increased blood sugar and irritability  Increased blood sugar and irritability     Diclofenac Sodium Other (See Comments)    Other reaction(s): Other (See Comments) Gi gas  Gi gas     Esomeprazole Magnesium Other (See Comments)    Other reaction(s): Other (See Comments) Increased acid  Increased acid     Nsaids Other (See Comments)    Increased blood sugar   Amitriptyline Rash    Other reaction(s): Other (See Comments) jittery jittery     Medications Reviewed Today   Medications were not reviewed in this encounter     Patient Active Problem List   Diagnosis Date Noted   Housing insecurity 06/10/2023   Tobacco abuse 06/10/2023   Passive suicidal ideations 06/10/2023   Chronic left shoulder pain 03/07/2023   Abscess 07/26/2022   High risk medication use 06/24/2022   Tachycardia 06/24/2022   Opioid use disorder, moderate, in sustained remission (HCC) 06/24/2022   Cocaine use disorder, moderate, in sustained remission (HCC) 06/24/2022   Alcohol use disorder, moderate, in sustained remission (HCC) 06/24/2022   Current moderate episode of major depressive disorder (HCC) 04/23/2022   Encounter for screening colonoscopy    Unintentional weight loss 01/21/2022   PTSD (post-traumatic stress disorder) 12/29/2020   Erectile dysfunction 01/04/2020   Chronic pain of left knee 08/27/2014   OSA on CPAP 08/27/2014   Seasonal allergies 08/27/2014   Chronic low back pain 03/29/2014   COPD (chronic obstructive pulmonary disease) (HCC) 03/29/2014  Hyperlipidemia 03/29/2014   Type 2 diabetes mellitus with hyperglycemia, with long-term current use of insulin  (HCC) 03/20/2014   GERD (gastroesophageal reflux disease) 03/20/2014   Hypertension associated with  diabetes (HCC) 03/20/2014    Conditions to be addressed/monitored per PCP order:   PTSD  Care Plan : LCSW Plan of Care  Updates made by Merlynn Lyle CROME, LCSW since 06/28/2023 12:00 AM     Problem: Coping Skills (General Plan of Care)      Goal: Coping Skills Enhanced   Start Date: 06/28/2023  Note:     Timeframe:  Short-Range Goal Priority:  High Start Date:   06/28/23        Expected End Date:  ongoing                     Follow Up Date--07/19/23 at 3 pm  - keep 90 percent of scheduled appointments -consider counseling or psychiatry -consider bumping up your self-care  -consider creating a stronger support network   Why is this important?             Combating depression may take some time.            If you don't feel better right away, don't give up on your treatment plan.    Current barriers:   Chronic Mental Health needs related to symptoms of PTSD, depression, stress and anxiety symptoms. Patient requires Support, Education, Resources, Referrals, Advocacy, and Care Coordination, in order to meet Unmet Mental Health Needs and to find a therapist and psychiatrist. Patient is homeless and living out of his car. SDOH needs. Communication barrier-Cannot Read or Write. Patient will implement clinical interventions discussed today to decrease symptoms of depression and increase knowledge and/or ability of: coping skills. Mental Health Concerns and Social Isolation Patient lacks knowledge of available community counseling agencies and resources.  Clinical Goal(s): verbalize understanding of plan for management of PTSD symptoms and demonstrate a reduction in symptoms. Patient will connect with a provider for ongoing mental health treatment, increase coping skills, healthy habits, self-management skills, and stress reduction        Clinical Interventions:  Assessed patient's previous and current treatment, coping skills, support system and barriers to care. Patient provided hx.  Patient reports that he does not read or write and is living in his vehicle at this time. He suffers from PTSD and is in need of mental health resource connection. He denies any current urgent concerns at this time. He reports issues with affording medications but does have an upcoming pharmacy appointment on 07/19/22. Patient previously was seen at [facility] and is agreeable to [facility] LCSW placing a new referral there today in order for him to re-engage in behavioral health treatment with long term providers. Brief self-care and depression coping skill management education provided to patient during session today.  Verbalization of feelings encouraged, motivational interviewing employed Emotional support provided, positive coping strategies explored. Establishing healthy boundaries emphasized and healthy self-care education provided Patient was educated on available mental health resources within their area that accept Medicaid and offer counseling and psychiatry. Patient was advised to contact the back of his insurance card for assistance with benefits as well. Patient educated on the difference between therapy and psychiatry per patient request Email sent to patient today with available mental health resources within his area that accept Medicaid and offer the services that he is interested in.  Emotional support provided. CBT intervention implemented regarding being mentally fit by combating negative thinking and  replacing it with uplifting support, hope and positivity. Patient reports significant worsening PTSD symptoms impacting his ability to function appropriately and carry out daily task. Assessed social determinant of health barriers LCSW provided education on relaxation techniques such as meditation, deep breathing, massage, grounding exercises or yoga that can activate the body's relaxation response and ease symptoms of stress and anxiety. LCSW ask that when pt is struggling with difficult emotions and  racing thoughts that they start this relaxation response process. LCSW provided extensive education on healthy coping skills for anxiety. SW used active and reflective listening, validated patient's feelings/concerns, and provided emotional support. Patient will work on implementing appropriate self-care habits into their daily routine such as: staying positive, writing a gratitude list, drinking water, staying active around the house, taking their medications as prescribed, combating negative thoughts or emotions and staying connected with their family and friends. Positive reinforcement provided for this decision to work on this. LCSW provided education on healthy sleep hygiene and what that looks like. LCSW encouraged patient to implement a night time routine into their schedule that works best for them and that they are able to maintain. Advised patient to implement deep breathing/grounding/meditation/self-care exercises into their nightly routine to combat racing thoughts at night. LCSW encouraged patient to wake up at the same time each day, make their sleeping environment comfortable, exercise when able, to limit naps and to not eat or drink anything right before bed.  Motivational Interviewing employed Depression screen reviewed  PHQ2/ PHQ9 completed or reviewed  Mindfulness or Relaxation training provided Active listening / Reflection utilized  Advance Care and HCPOA education provided Emotional Support Provided Problem Solving /Task Center strategies reviewed Provided psychoeducation for mental health needs  Provided brief CBT  Reviewed mental health medications and discussed importance of compliance:  Quality of sleep assessed & Sleep Hygiene techniques promoted  Participation in counseling encouraged  Verbalization of feelings encouraged  Suicidal Ideation/Homicidal Ideation assessed: Patient denies SI/HI  Review resources, discussed options and provided patient information about   Mental Health Resources Inter-disciplinary care team collaboration (see longitudinal plan of care)  Patient Goals/Self-Care Activities: Take medications as prescribed   Attend all scheduled provider appointments Call pharmacy for medication refills 3-7 days in advance of running out of medications Perform all self care activities independently  Perform IADL's (shopping, preparing meals, housekeeping, managing finances) independently Call provider office for new concerns or questions Work with the social worker to address care coordination needs and will continue to work with the clinical team to address health care and disease management related needs call 1-800-273-TALK (toll free, 24 hour hotline) If in a crisis, go to [facility] [address] [place] [phone]) call 911 if experiencing a Mental Health or Behavioral Health Crisis  Utilize healthy coping skills and supportive resources discussed Contact PCP with any questions or concerns Keep 90 percent of counseling appointments Call your insurance provider for more information about your Enhanced Benefits  Check out counseling resources provided  Begin personal counseling with LCSW, to reduce and manage symptoms of Depression and Stress, until well-established with mental health provider Accept all calls from representative with Behavioral Health Providers in an effort to establish ongoing mental health counseling and supportive services. Incorporate into daily practice - relaxation techniques, deep breathing exercises, and mindfulness meditation strategies. Talk about feelings with friends, family members, spiritual advisor, etc. Contact LCSW directly [phone]), if you have questions, need assistance, or if additional social work needs are identified between now and  our next scheduled telephone outreach call. Call 988 for mental health hotline/crisis line if needed (24/7  available) Try techniques to reduce symptoms of anxiety/negative thinking (deep breathing, distraction, positive self talk, etc)  - develop a personal safety plan - develop a plan to deal with triggers like holidays, anniversaries - exercise at least 2 to 3 times per week - have a plan for how to handle bad days - journal feelings and what helps to feel better or worse - spend time or talk with others at least 2 to 3 times per week - watch for early signs of feeling worse - begin personal counseling - call and visit an old friend - check out volunteer opportunities - join a support group - laugh; watch a funny movie or comedian - learn and use visualization or guided imagery - perform a random act of kindness - practice relaxation or meditation daily - start or continue a personal journal - practice positive thinking and self-talk -continue with compliance of taking medication  -identify current effective and ineffective coping strategies.  -implement positive self-talk in care to increase self-esteem, confidence and feelings of control.  -consider alternative and complementary therapy approaches such as meditation, mindfulness or yoga.  Call your insurance provider to gain education on benefits if desired Call your primary care doctor if symptoms get worse  -journaling, prayer, worship services, meditation or pastoral counseling.  -increase participation in pleasurable group activities such as hobbies, singing, sports or volunteering).  -consider the use of meditative movement therapy such as tai chi, yoga or qigong.  -start a regular daily exercise program based on tolerance, ability and patient choice to support positive thinking and activity    Follow Up Plan:  The patient has been provided with contact information for the care management team and has been advised to call with any mental health or health related questions or concerns.  The care management team will reach out to the  patient again over the next 30 business  days.   If you are experiencing a Mental Health or Behavioral Health Crisis or need someone to talk to, please call the Suicide and Crisis Lifeline: 988    Patient Goals: Initial goal      Follow up:  Patient agrees to Care Plan and Follow-up.  Plan: The Managed Medicaid care management team will reach out to the patient again over the next 30 days.  Lyle Rung, BSW, MSW, LCSW Licensed Clinical Social Worker [employer]   [email or web address] Direct Dial: [phone]

## 2023-06-28 NOTE — Patient Instructions (Signed)
 Visit Information  [name] was given information about Medicaid Managed Care team care coordination services as a part of their Healthy [facility] Medicaid benefit. Karl Weaver. verbally consented to engagement with the [facility] Managed Care team.   If you are experiencing a medical emergency, please call 911 or report to your local emergency department or urgent care.   If you have a non-emergency medical problem during routine business hours, please contact your provider's office and ask to speak with a nurse.   For questions related to your Healthy [facility] health plan, please call: [phone] or visit the homepage here: [email or web address]  If you would like to schedule transportation through your Healthy [facility] plan, please call the following number at least 2 days in advance of your appointment: [phone]  For information about your ride after you set it up, call Ride Assist at [phone]. Use this number to activate a Will Call pickup, or if your transportation is late for a scheduled pickup. Use this number, too, if you need to make a change or cancel a previously scheduled reservation.  If you need transportation services right away, call [phone]. The after-hours call center is staffed 24 hours to handle ride assistance and urgent reservation requests (including discharges) 365 days a year. Urgent trips include sick visits, hospital discharge requests and life-sustaining treatment.  Call the [facility] Line at [phone], at any time, 24 hours a day, 7 days a week. If you are in danger or need immediate medical attention call 911.  If you would like help to quit smoking, call 1-800-QUIT-NOW (931-023-1283) OR Espaol: 1-855-Djelo-Ya (8-144-664-6430) o para ms informacin haga clic aqu or Text READY to 799-599 to register via text  Following is a copy of your plan of care:  Care Plan : LCSW Plan of  Care  Updates made by Merlynn Lyle CROME, LCSW since 06/28/2023 12:00 AM     Problem: Coping Skills (General Plan of Care)      Goal: Coping Skills Enhanced   Start Date: 06/28/2023  Note:     Timeframe:  Short-Range Goal Priority:  High Start Date:   06/28/23        Expected End Date:  ongoing                     Follow Up Date--07/19/23 at 3 pm  - keep 90 percent of scheduled appointments -consider counseling or psychiatry -consider bumping up your self-care  -consider creating a stronger support network   Why is this important?             Combating depression may take some time.            If you don't feel better right away, don't give up on your treatment plan.    Current barriers:   Chronic Mental Health needs related to symptoms of PTSD, depression, stress and anxiety symptoms. Patient requires Support, Education, Resources, Referrals, Advocacy, and Care Coordination, in order to meet Unmet Mental Health Needs and to find a therapist and psychiatrist. Patient is homeless and living out of his car. SDOH needs. Communication barrier-Cannot Read or Write. Patient will implement clinical interventions discussed today to decrease symptoms of depression and increase knowledge and/or ability of: coping skills. Mental Health Concerns and Social Isolation Patient lacks knowledge of available community counseling agencies and resources.  Clinical Goal(s): verbalize understanding of plan for management of PTSD symptoms and demonstrate a reduction in symptoms. Patient will connect with  a provider for ongoing mental health treatment, increase coping skills, healthy habits, self-management skills, and stress reduction        Patient Goals/Self-Care Activities: Take medications as prescribed   Attend all scheduled provider appointments Call pharmacy for medication refills 3-7 days in advance of running out of medications Perform all self care activities independently  Perform IADL's  (shopping, preparing meals, housekeeping, managing finances) independently Call provider office for new concerns or questions Work with the social worker to address care coordination needs and will continue to work with the clinical team to address health care and disease management related needs call 1-800-273-TALK (toll free, 24 hour hotline) If in a crisis, go to [facility] [address], [place] [phone]) call 911 if experiencing a Mental Health or Behavioral Health Crisis  Utilize healthy coping skills and supportive resources discussed Contact PCP with any questions or concerns Keep 90 percent of counseling appointments Call your insurance provider for more information about your Enhanced Benefits  Check out counseling resources provided  Begin personal counseling with LCSW, to reduce and manage symptoms of Depression and Stress, until well-established with mental health provider Accept all calls from representative with Behavioral Health Providers in an effort to establish ongoing mental health counseling and supportive services. Incorporate into daily practice - relaxation techniques, deep breathing exercises, and mindfulness meditation strategies. Talk about feelings with friends, family members, spiritual advisor, etc. Contact LCSW directly [phone]), if you have questions, need assistance, or if additional social work needs are identified between now and our next scheduled telephone outreach call. Call 988 for mental health hotline/crisis line if needed (24/7 available) Try techniques to reduce symptoms of anxiety/negative thinking (deep breathing, distraction, positive self talk, etc)  - develop a personal safety plan - develop a plan to deal with triggers like holidays, anniversaries - exercise at least 2 to 3 times per week - have a plan for how to handle bad days - journal feelings and what helps to feel better or worse - spend  time or talk with others at least 2 to 3 times per week - watch for early signs of feeling worse - begin personal counseling - call and visit an old friend - check out volunteer opportunities - join a support group - laugh; watch a funny movie or comedian - learn and use visualization or guided imagery - perform a random act of kindness - practice relaxation or meditation daily - start or continue a personal journal - practice positive thinking and self-talk -continue with compliance of taking medication  -identify current effective and ineffective coping strategies.  -implement positive self-talk in care to increase self-esteem, confidence and feelings of control.  -consider alternative and complementary therapy approaches such as meditation, mindfulness or yoga.  Call your insurance provider to gain education on benefits if desired Call your primary care doctor if symptoms get worse  -journaling, prayer, worship services, meditation or pastoral counseling.  -increase participation in pleasurable group activities such as hobbies, singing, sports or volunteering).  -consider the use of meditative movement therapy such as tai chi, yoga or qigong.  -start a regular daily exercise program based on tolerance, ability and patient choice to support positive thinking and activity    Follow Up Plan:  The patient has been provided with contact information for the care management team and has been advised to call with any mental health or health related questions or concerns.  The care management team will reach out to the  patient again over the next 30 business  days.   If you are experiencing a Mental Health or Behavioral Health Crisis or need someone to talk to, please call the Suicide and Crisis Lifeline: 988    Patient Goals: Initial goal     24- Hour Availability:    [facility]  [address] [place], [place] [phone] Crisis [phone]    [facility] [phone]  [facility]  [phone]    [facility]  [phone] (after hours)   Therapeutic Alternative/Mobile Crisis   [phone]   [place]  National Suicide Hotline  701-061-5243 [place]) OR 988   Call (878) [phone] for mental health emergencies   [facility]  [phone]);  [place] and [employer]  [phone]); [place], [place], [place], [place], [place], [place], [place]    [facility] for [facility] Residents For 24/7 walk-up access to mental health services for [facility] children (4+), adolescents and adults, please visit the [facility] located at [address] in [place], [place].  *[facility] also provides comprehensive outpatient behavioral health services in a variety of locations around the [place].  Connect With Us  [address] [place], [place] [address] HelpLine: [phone] or [phone]  Get Directions  Find Help 24/7 By Phone Call our 24-hour HelpLine at 870-494-1088 or (319)072-4704 for immediate assistance for mental health and substance abuse issues.  Walk-In Help [place]: [facility] (Ages 4 and Up) [place]: Emergency Dept., [facility] Additional Resources National Hopeline Network: 1-800-SUICIDE The National Suicide Prevention Lifeline: 1-800-273-TALK     The following coping skill education was provided for stress relief and mental health management: When your car dies or a deadline looms, how do you respond? Long-term, low-grade or acute stress takes a serious toll on your body and mind, so don't ignore feelings of constant tension. Stress is a natural part of life. However, too much stress can harm our health, especially if it continues every day. This is chronic stress and can put you at risk for heart problems  like heart disease and depression. Understand what's happening inside your body and learn simple coping skills to combat the negative impacts of everyday stressors.  Types of Stress There are two types of stress: Emotional - types of emotional stress are relationship problems, pressure at work, financial worries, experiencing discrimination or having a major life change. Physical - Examples of physical stress include being sick having pain, not sleeping well, recovery from an injury or having an alcohol and drug use disorder. Fight or Flight Sudden or ongoing stress activates your nervous system and floods your bloodstream with adrenaline and cortisol, two hormones that raise blood pressure, increase heart rate and spike blood sugar. These changes pitch your body into a fight or flight response. That enabled our ancestors to outrun saber-toothed tigers, and it's helpful today for situations like dodging a car accident. But most modern chronic stressors, such as finances or a challenging relationship, keep your body in that heightened state, which hurts your health. Effects of Too Much Stress If constantly under stress, most of us  will eventually start to function less well.  Multiple studies link chronic stress to a higher risk of heart disease, stroke, depression, weight gain, memory loss and even premature death, so it's important to recognize the warning signals. Talk to your doctor about ways to manage stress  if you're experiencing any of these symptoms: Prolonged periods of poor sleep. Regular, severe headaches. Unexplained weight loss or gain. Feelings of isolation, withdrawal or worthlessness. Constant anger and irritability. Loss of interest in activities. Constant worrying or obsessive thinking. Excessive alcohol or drug use. Inability to concentrate.  10 Ways to Cope with Chronic Stress It's key to recognize stressful situations as they occur because it allows you to focus on  managing how you react. We all need to know when to close our eyes and take a deep breath when we feel tension rising. Use these tips to prevent or reduce chronic stress. 1. Rebalance Work and Home All work and no play? If you're spending too much time at the office, intentionally put more dates in your calendar to enjoy time for fun, either alone or with others. 2. Get Regular Exercise Moving your body on a regular basis balances the nervous system and increases blood circulation, helping to flush out stress hormones. Even a daily 20-minute walk makes a difference. Any kind of exercise can lower stress and improve your mood ? just pick activities that you enjoy and make it a regular habit. 3. Eat Well and Limit Alcohol and Stimulants Alcohol, nicotine and caffeine may temporarily relieve stress but have negative health impacts and can make stress worse in the long run. Well-nourished bodies cope better, so start with a good breakfast, add more organic fruits and vegetables for a well-balanced diet, avoid processed foods and sugar, try herbal tea and drink more water. 4. Connect with Supportive People Talking face to face with another person releases hormones that reduce stress. Lean on those good listeners in your life. 5. Carve Out Hobby Time Do you enjoy gardening, reading, listening to music or some other creative pursuit? Engage in activities that bring you pleasure and joy; research shows that reduces stress by almost half and lowers your heart rate, too. 6. Practice Meditation, Stress Reduction or Yoga Relaxation techniques activate a state of restfulness that counterbalances your body's fight-or-flight hormones. Even if this also means a 10-minute break in a long day: listen to music, read, go for a walk in nature, do a hobby, take a bath or spend time with a friend. Also consider doing a mindfulness exercise or try a daily deep breathing or imagery practice. Deep Breathing Slow, calm and deep  breathing can help you relax. Try these steps to focus on your breathing and repeat as needed. Find a comfortable position and close your eyes. Exhale and drop your shoulders. Breathe in through your nose; fill your lungs and then your belly. Think of relaxing your body, quieting your mind and becoming calm and peaceful. Breathe out slowly through your nose, relaxing your belly. Think of releasing tension, pain, worries or distress. Repeat steps three and four until you feel relaxed. Imagery This involves using your mind to excite the senses -- sound, vision, smell, taste and feeling. This may help ease your stress. Begin by getting comfortable and then do some slow breathing. Imagine a place you love being at. It could be somewhere from your childhood, somewhere you vacationed or just a place in your imagination. Feel how it is to be in the place you're imagining. Pay attention to the sounds, air, colors, and who is there with you. This is a place where you feel cared for and loved. All is well. You are safe. Take in all the smells, sounds, tastes and feelings. As you do, feel your body being nourished and  healed. Feel the calm that surrounds you. Breathe in all the good. Breathe out any discomfort or tension. 7. Sleep Enough If you get less than seven to eight hours of sleep, your body won't tolerate stress as well as it could. If stress keeps you up at night, address the cause, and add extra meditation into your day to make up for the lost z's. Try to get seven to nine hours of sleep each night. Make a regular bedtime schedule. Keep your room dark and cool. Try to avoid computers, TV, cell phones and tablets before bed. 8. Bond with Connections You Enjoy Go out for a coffee with a friend, chat with a neighbor, call a family member, visit with a clergy member, or even hang out with your pet. Clinical studies show that spending even a short time with a companion animal can cut anxiety levels almost  in half. 9. Take a Vacation Getting away from it all can reset your stress tolerance by increasing your mental and emotional outlook, which makes you a happier, more productive person upon return. Leave your cellphone and laptop at home! 10. See a Counselor, Coach or Therapist If negative thoughts overwhelm your ability to make positive changes, it's time to seek professional help. Make an appointment today--your health and life are worth it.  Lyle Rung, BSW, MSW, LCSW Licensed Clinical Social Worker [employer]   [email or web address] Direct Dial: [phone]

## 2023-07-06 ENCOUNTER — Ambulatory Visit: Payer: Medicaid Other | Admitting: Pediatrics

## 2023-07-06 ENCOUNTER — Encounter: Payer: Self-pay | Admitting: Pediatrics

## 2023-07-06 VITALS — BP 100/61 | HR 94 | Temp 98.6°F | Resp 15 | Ht 70.75 in | Wt 182.6 lb

## 2023-07-06 DIAGNOSIS — Z7689 Persons encountering health services in other specified circumstances: Secondary | ICD-10-CM | POA: Diagnosis not present

## 2023-07-06 DIAGNOSIS — E1165 Type 2 diabetes mellitus with hyperglycemia: Secondary | ICD-10-CM

## 2023-07-06 DIAGNOSIS — G8929 Other chronic pain: Secondary | ICD-10-CM

## 2023-07-06 DIAGNOSIS — M5442 Lumbago with sciatica, left side: Secondary | ICD-10-CM

## 2023-07-06 DIAGNOSIS — F4310 Post-traumatic stress disorder, unspecified: Secondary | ICD-10-CM

## 2023-07-06 DIAGNOSIS — Z1330 Encounter for screening examination for mental health and behavioral disorders, unspecified: Secondary | ICD-10-CM

## 2023-07-06 DIAGNOSIS — Z794 Long term (current) use of insulin: Secondary | ICD-10-CM | POA: Diagnosis not present

## 2023-07-06 DIAGNOSIS — Z599 Problem related to housing and economic circumstances, unspecified: Secondary | ICD-10-CM

## 2023-07-06 LAB — MICROALBUMIN, URINE WAIVED
Creatinine, Urine Waived: 200 mg/dL (ref 10–300)
Microalb, Ur Waived: 80 mg/L — ABNORMAL HIGH (ref 0–19)

## 2023-07-06 NOTE — Assessment & Plan Note (Signed)
Poorly controlled, currently on insulin and glipizide. Patient has had difficulty with medication adherence due to financial and housing instability. -Consider switching to Ozempic (semaglutide) once weekly injection if covered by insurance. -Check blood glucose levels when possible.

## 2023-07-06 NOTE — Assessment & Plan Note (Signed)
Recent onset of sciatica in the left leg. History of back surgery with metal cage on L3, 4, and 5 per patient. 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  -Order x-ray of the back to evaluate current status post previous surgery. -Consider trial of Lyrica (pregabalin) for nerve pain if covered by insurance.

## 2023-07-06 NOTE — Assessment & Plan Note (Signed)
Chronic issue, currently experiencing increased irritability and difficulty dealing with people daily. Previous treatment with Viibryd (vilazodone) discontinued due to high copays. Patient has had adverse reactions to some medications in the past. -Referral to psychiatry for further evaluation and management.

## 2023-07-06 NOTE — Progress Notes (Signed)
Office Visit  BP 100/61 (BP Location: Left Arm, Patient Position: Sitting, Cuff Size: Normal)   Pulse 94   Temp 98.6 F (37 C) (Oral)   Resp 15   Ht 5' 10.75" (1.797 m)   Wt 182 lb 9.6 oz (82.8 kg)   SpO2 98%   BMI 25.65 kg/m    Subjective:    Patient ID: Karl Media., male    DOB: July 13, 1960, 63 y.o.   MRN: [id number]  HPI: Karl Calica. is a 63 y.o. male  Chief Complaint  Patient presents with   Establish Care    Last PCP was at [facility] was APP and he felt he needs an MD.  Right knee last year fell and hurt knee. Back l3/4/5 has a cage 2005 was the surgery and only recently starting hurting him. 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     Discussed the use of AI scribe software for clinical note transcription with the patient, who gave verbal consent to proceed.  History of Present Illness   The patient, with a history of PTSD, type 2 diabetes, and back surgery, presents with worsening mood and irritability. He reports increasing difficulty dealing with people daily. He was previously seeing a provider at [facility] and was prescribed Viibryd (vilazodone), but he is no longer taking this medication due to high copays and financial difficulties following a divorce three years ago.  The patient also reports a recent onset of sciatica in the left leg, which he has not experienced in years. This has been a significant issue, causing severe pain and affecting his mobility. He has a history of back surgery with a metal cage on L3, 4, and 5 due to a severe injury six years ago.  In addition to these issues, the patient has been struggling with poorly controlled type 2 diabetes. He was diagnosed five to six years ago when he presented with severe symptoms including blurred vision and polyuria. He is currently on insulin and glipizide, but his blood sugars have remained high.  The patient also reports knee pain, for which he has been receiving injections at an orthopedic  clinic. He has had three surgeries on one knee and has been experiencing persistent, throbbing pain in the other.  The patient is currently homeless, staying with friends or living in his [place]. He has lost most of his belongings, including his glucometer. He is awaiting assistance for housing and is in contact with social work services. He expresses a desire to return to work, previously having worked in [employer].     Relevant past medical, surgical, family and social history reviewed and updated as indicated. Interim medical history since our last visit reviewed. Allergies and medications reviewed and updated.  ROS per HPI unless specifically indicated above     Objective:    BP 100/61 (BP Location: Left Arm, Patient Position: Sitting, Cuff Size: Normal)   Pulse 94   Temp 98.6 F (37 C) (Oral)   Resp 15   Ht 5' 10.75" (1.797 m)   Wt 182 lb 9.6 oz (82.8 kg)   SpO2 98%   BMI 25.65 kg/m   Wt Readings from Last 3 Encounters:  07/06/23 182 lb 9.6 oz (82.8 kg)  06/10/23 184 lb 3.2 oz (83.6 kg)  03/07/23 180 lb (81.6 kg)     Physical Exam Constitutional:      Appearance: Normal appearance.  Pulmonary:     Effort: Pulmonary effort is normal.  Musculoskeletal:  Lumbar back: Tenderness present. Positive left straight leg raise test.       Back:  Skin:    Comments: Normal skin color  Neurological:     General: No focal deficit present.     Mental Status: He is alert. Mental status is at baseline.  Psychiatric:        Mood and Affect: Mood normal.        Behavior: Behavior normal.        Thought Content: Thought content normal.         07/07/2023    2:22 PM 06/10/2023   11:44 AM 07/26/2022   10:10 AM 06/24/2022    3:40 PM 04/23/2022   12:18 PM  Depression screen PHQ 2/9  Decreased Interest 1 3 2   0  Down, Depressed, Hopeless 1 3 3  2   PHQ - 2 Score 2 6 5  2   Altered sleeping 2 3 3  3   Tired, decreased energy 3 3 2  1   Change in appetite 1 2 0  3  Feeling bad or  failure about yourself  1 3 3  3   Trouble concentrating 1 1 2  2   Moving slowly or fidgety/restless 1 3 1  2   Suicidal thoughts 1 3 2  1   PHQ-9 Score 12 24 18  17   Difficult doing work/chores Very difficult Very difficult   Somewhat difficult     Information is confidential and restricted. Go to Review Flowsheets to unlock data.       07/07/2023    2:24 PM 06/10/2023   11:44 AM 07/26/2022   10:12 AM 06/24/2022    3:41 PM  GAD 7 : Generalized Anxiety Score  Nervous, Anxious, on Edge 1 1 3    Control/stop worrying 2 2 3    Worry too much - different things 2  2   Trouble relaxing 1  3   Restless 1  2   Easily annoyed or irritable 2  3   Afraid - awful might happen 1  2   Total GAD 7 Score 10  18   Anxiety Difficulty Very difficult  Very difficult      Information is confidential and restricted. Go to Review Flowsheets to unlock data.       Assessment & Plan:  Assessment & Plan   Type 2 diabetes mellitus with hyperglycemia, with long-term current use of insulin (HCC) Assessment & Plan: Poorly controlled, currently on insulin and glipizide. Patient has had difficulty with medication adherence due to financial and housing instability. -Consider switching to Ozempic (semaglutide) once weekly injection if covered by insurance. -Check blood glucose levels when possible.  Orders: -     Microalbumin, Urine Waived  PTSD (post-traumatic stress disorder) Assessment & Plan: Chronic issue, currently experiencing increased irritability and difficulty dealing with people daily. Previous treatment with Viibryd (vilazodone) discontinued due to high copays. Patient has had adverse reactions to some medications in the past. -Referral to psychiatry for further evaluation and management.  Orders: -     Ambulatory referral to Psychiatry  Chronic left-sided low back pain with left-sided sciatica Assessment & Plan: Recent onset of sciatica in the left leg. History of back surgery with metal  cage on L3, 4, and 5 per patient. 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  -Order x-ray of the back to evaluate current status post previous surgery. -Consider trial of Lyrica (pregabalin) for nerve pain if covered by insurance.  Orders: -     DG Lumbar Spine Complete; Future  Encounter to establish care Reviewed available patient record including history, medications, problem list. HM updated as able. Will review and/or request outside records (if applicable) and will fill remaining HM gaps as needed at follow up visit.  Encounter for behavioral health screening As part of their intake evaluation, the patient was screened for depression, anxiety.  PHQ9 SCORE 12, GAD7 SCORE 10. Screening results positive for tested conditions. CTM and discuss at follow up.  Financial difficulties Recent divorce leading to financial instability and housing insecurity. -Continue working with social work for housing assistance. Has SW already, will FYI her given med assist application to be submitted    Follow up plan: Return in about 2 weeks (around 07/20/2023) for DM, skin issues.  Mila Pair Howell Pringle, MD

## 2023-07-06 NOTE — Patient Instructions (Addendum)
Will send medications as soon as we get med assist approval. I want to start a medication called trulicity which is a once weekly injection  Back imaging: [facility] [address] [place],  [place]  [address]  Good to meet you! Welcome to [facility]!  As your primary care doctor, I look forward to working with you to help you reach your health goals.  Please be aware of a couple of logistical items: - If you message me on mychart, it may take me 1-2 business days to get back to you. This is for non-urgent messaging.  - If you require urgent clinical attention, please call the clinic or present to urgent care/emergency room - If you have labs, I typically will send a message about them in 1-2 business days. - I am not here on Mondays, otherwise will be available from Tuesday-Friday during 8a-5pm.

## 2023-07-07 ENCOUNTER — Telehealth: Payer: Self-pay

## 2023-07-07 NOTE — Progress Notes (Signed)
   07/07/2023  Patient ID: Karl Weaver., male   DOB: 1960-09-18, 63 y.o.   MRN: [id number]  Clinic routed request from patient's primary care provider, Dr. Evelene Croon, to follow up regularly with patient in between visits with her.  Patient is currently scheduled with another clinical pharmacist on February 5th based on his previous primary care provider clinic.  Contacted patient to reschedule this appointment to a time with me, now that he will be followed by Dr. Evelene Croon.  Telephone visit is scheduled for February 5th at 2:30 PM.  Lenna Gilford, PharmD, DPLA

## 2023-07-12 ENCOUNTER — Encounter: Payer: Self-pay | Admitting: Pediatrics

## 2023-07-19 ENCOUNTER — Other Ambulatory Visit: Payer: Self-pay | Admitting: Licensed Clinical Social Worker

## 2023-07-19 NOTE — Patient Outreach (Addendum)
 Medicaid Managed Care Social Work Note  07/19/2023 Name:  Karl Weaver. MRN:  [id number] DOB:  1960-12-01  Karl Weaver. is an 63 y.o. year old male who is a primary patient of Herold, Hadassah SQUIBB, Weaver.  The Medicaid Managed Care Coordination team was consulted for assistance with:  Mental Health Counseling and Resources  Mr. Evetts was given information about Medicaid Managed Care Coordination team services today. Karl Weaver. Patient agreed to services and verbal consent obtained.  Engaged with patient  for by telephone forfollow up visit in response to referral for case management and/or care coordination services.   Patient is participating in a Managed Medicaid Plan:  Yes  Assessments/Interventions:  Review of past medical history, allergies, medications, health status, including review of consultants reports, laboratory and other test data, was performed as part of comprehensive evaluation and provision of chronic care management services.  SDOH: (Social Drivers of Health) assessments and interventions performed: SDOH Interventions    Flowsheet Row Office Visit from 07/06/2023 in [facility] Patient Outreach Telephone from 06/28/2023 in [place] [facility] Patient Outreach Telephone from 06/20/2023 in  POPULATION HEALTH DEPARTMENT Office Visit from 06/10/2023 in [facility] [place]  SDOH Interventions      Food Insecurity Interventions -- -- Other (Comment)  [BSW referral] --  Housing Interventions -- [employer] Provided Other (Comment)  [BSW referral-scheduled on 06/24/23] --  Transportation Interventions -- -- Intervention Not Indicated --  Utilities Interventions -- -- Other (Comment)  [BSW referral] --  Alcohol Usage Interventions Intervention Not Indicated (Score <7) -- -- --  Depression Interventions/Treatment  Community Resources Provided -- -- Referral to  Psychiatry  Financial Strain Interventions -- [employer] Provided -- --  Stress Interventions -- Provide Counseling, [employer] Provided -- --  Health Literacy Interventions Intervention Not Indicated -- -- --       Advanced Directives Status:  See Care Plan for related entries.  Care Plan                 Allergies  Allergen Reactions   Pork Allergy Anaphylaxis   Codeine Itching   Cyclobenzaprine  Other (See Comments)    Other reaction(s): Other (See Comments) Increased blood sugar and irritability  Increased blood sugar and irritability     Diclofenac Sodium Other (See Comments)    Other reaction(s): Other (See Comments) Gi gas  Gi gas     Esomeprazole Magnesium Other (See Comments)    Other reaction(s): Other (See Comments) Increased acid  Increased acid     Nsaids Other (See Comments)    Increased blood sugar   Amitriptyline Rash    Other reaction(s): Other (See Comments) jittery jittery     Medications Reviewed Today     Reviewed by Karl Lyle CROME, LCSW (Social Worker) on 07/19/23 at 1615  Med List Status: <None>   Medication Order Taking? Sig Documenting Provider Last Dose Status Informant  beclomethasone (QVAR) 40 MCG/ACT inhaler [id number] No Inhale 2 puffs into the lungs 2 (two) times daily.  Patient not taking: Reported on 06/10/2023   Provider, Historical, Weaver Not Taking Active Self  famotidine  (PEPCID ) 20 MG tablet [id number] No Take 1 tablet (20 mg total) by mouth 2 (two) times daily. Karl Lynwood HERO, NP Taking Active   glipiZIDE  (GLUCOTROL  XL) 10 MG 24 hr tablet [id number] No Take 1 tablet (10 mg total) by mouth daily with breakfast.  Patient not taking: Reported  on 06/10/2023   Karl Lynwood HERO, NP Not Taking Active   glucose blood (ACCU-CHEK GUIDE) test strip [id number] No Check sugar 2 times daily. DX E11.69  Patient not taking: Reported on 07/06/2023   Karl Lynwood HERO, NP Not Taking Active   insulin  glargine-yfgn (SEMGLEE ) 100 UNIT/ML  injection [id number] No INJECT 0.3 MLS (30 UNITS TOTAL) INTO THE SKIN AT BEDTIME. Karl Lynwood HERO, NP Taking Active   Insulin  Pen Needle 31G X 8 MM MISC [id number] No Use to inject insulin  once daily Karl Lynwood HERO, NP Taking Active            Med Note ADOLPHUS SHU   Mon Mar 07, 2023 10:49 AM) Needs refill  methocarbamol  (ROBAXIN ) 500 MG tablet [id number] No Take 1 tablet (500 mg total) by mouth 2 (two) times daily as needed for muscle spasms.  Patient not taking: Reported on 06/10/2023   Karl Lynwood HERO, NP Not Taking Active   Vilazodone  HCl (VIIBRYD ) 10 MG TABS [id number]  Take 1 tablet (10 mg total) by mouth daily with breakfast for 7 days. Karl Weaver  Expired 07/01/22 2359   Vilazodone  HCl (VIIBRYD ) 20 MG TABS [id number] No Take 1 tablet (20 mg total) by mouth daily with breakfast.  Patient not taking: Reported on 07/26/2022   Karl Weaver Not Taking Expired 08/30/22 2359             Patient Active Problem List   Diagnosis Date Noted   Housing insecurity 06/10/2023   Tobacco abuse 06/10/2023   Passive suicidal ideations 06/10/2023   Chronic left shoulder pain 03/07/2023   Abscess 07/26/2022   High risk medication use 06/24/2022   Tachycardia 06/24/2022   Opioid use disorder, moderate, in sustained remission (HCC) 06/24/2022   Cocaine use disorder, moderate, in sustained remission (HCC) 06/24/2022   Alcohol use disorder, moderate, in sustained remission (HCC) 06/24/2022   Current moderate episode of major depressive disorder (HCC) 04/23/2022   Encounter for screening colonoscopy    Unintentional weight loss 01/21/2022   PTSD (post-traumatic stress disorder) 12/29/2020   Erectile dysfunction 01/04/2020   Chronic pain of left knee 08/27/2014   OSA on CPAP 08/27/2014   Seasonal allergies 08/27/2014   Chronic low back pain 03/29/2014   COPD (chronic obstructive pulmonary disease) (HCC) 03/29/2014   Hyperlipidemia 03/29/2014   Type 2 diabetes mellitus with  hyperglycemia, with long-term current use of insulin  (HCC) 03/20/2014   GERD (gastroesophageal reflux disease) 03/20/2014   Hypertension associated with diabetes (HCC) 03/20/2014    Care Plan : LCSW Plan of Care  Updates made by Karl Lyle CROME, LCSW since 07/19/2023 12:00 AM     Problem: Coping Skills (General Plan of Care)      Goal: Coping Skills Enhanced   Start Date: 06/28/2023  Note:     Timeframe:  Short-Range Goal Priority:  High Start Date:   06/28/23        Expected End Date:  ongoing                     Follow Up Date--Goal met and ended on 07/19/23  - keep 90 percent of scheduled appointments -consider counseling or psychiatry -consider bumping up your self-care  -consider creating a stronger support network   Why is this important?             Combating depression may take some time.            If you don't feel better right  away, don't give up on your treatment plan.    Current barriers:   Chronic Mental Health needs related to symptoms of PTSD, depression, stress and anxiety symptoms. Patient requires Support, Education, Resources, Referrals, Advocacy, and Care Coordination, in order to meet Unmet Mental Health Needs and to find a therapist and psychiatrist. Patient is homeless and living out of his car. SDOH needs. Communication barrier-Cannot Read or Write. Patient will implement clinical interventions discussed today to decrease symptoms of depression and increase knowledge and/or ability of: coping skills. Mental Health Concerns and Social Isolation Patient lacks knowledge of available community counseling agencies and resources.  Clinical Goal(s): verbalize understanding of plan for management of PTSD symptoms and demonstrate a reduction in symptoms. Patient will connect with a provider for ongoing mental health treatment, increase coping skills, healthy habits, self-management skills, and stress reduction        Clinical Interventions:  Assessed patient's  previous and current treatment, coping skills, support system and barriers to care. Patient provided hx. Patient reports that he does not read or write and is living in his vehicle at this time. He suffers from PTSD and is in need of mental health resource connection. He denies any current urgent concerns at this time. He reports issues with affording medications but does have an upcoming pharmacy appointment on 07/19/22. Patient previously was seen at [facility] and is agreeable to [facility] LCSW placing a new referral there today in order for him to re-engage in behavioral health treatment with long term providers. Brief self-care and depression coping skill management education provided to patient during session today.  Verbalization of feelings encouraged, motivational interviewing employed Emotional support provided, positive coping strategies explored. Establishing healthy boundaries emphasized and healthy self-care education provided Patient was educated on available mental health resources within their area that accept Medicaid and offer counseling and psychiatry. Patient was advised to contact the back of his insurance card for assistance with benefits as well. Patient educated on the difference between therapy and psychiatry per patient request Email sent to patient today with available mental health resources within his area that accept Medicaid and offer the services that he is interested in.  Emotional support provided. CBT intervention implemented regarding being mentally fit by combating negative thinking and replacing it with uplifting support, hope and positivity. Patient reports significant worsening PTSD symptoms impacting his ability to function appropriately and carry out daily task. Assessed social determinant of health barriers LCSW provided education on relaxation techniques such as meditation, deep breathing, massage, grounding exercises or yoga that can activate the body's relaxation response  and ease symptoms of stress and anxiety. LCSW ask that when pt is struggling with difficult emotions and racing thoughts that they start this relaxation response process. LCSW provided extensive education on healthy coping skills for anxiety. SW used active and reflective listening, validated patient's feelings/concerns, and provided emotional support. Patient will work on implementing appropriate self-care habits into their daily routine such as: staying positive, writing a gratitude list, drinking water, staying active around the house, taking their medications as prescribed, combating negative thoughts or emotions and staying connected with their family and friends. Positive reinforcement provided for this decision to work on this. LCSW provided education on healthy sleep hygiene and what that looks like. LCSW encouraged patient to implement a night time routine into their schedule that works best for them and that they are able to maintain. Advised patient to implement deep breathing/grounding/meditation/self-care exercises into their nightly routine to combat racing thoughts at night.  LCSW encouraged patient to wake up at the same time each day, make their sleeping environment comfortable, exercise when able, to limit naps and to not eat or drink anything right before bed.  Motivational Interviewing employed Depression screen reviewed  PHQ2/ PHQ9 completed or reviewed  Mindfulness or Relaxation training provided Active listening / Reflection utilized  Advance Care and HCPOA education provided Emotional Support Provided Problem Solving /Task Center strategies reviewed Provided psychoeducation for mental health needs  Provided brief CBT  Reviewed mental health medications and discussed importance of compliance:  Quality of sleep assessed & Sleep Hygiene techniques promoted  Participation in counseling encouraged  Verbalization of feelings encouraged  Suicidal Ideation/Homicidal Ideation  assessed: Patient denies SI/HI  Review resources, discussed options and provided patient information about  Mental Health Resources Inter-disciplinary care team collaboration (see longitudinal plan of care) [facility] LCSW successfully completed follow up call with patient. Patient reports that he has now been successfully scheduled for psychiatry at [facility] for 09/05/23. He reports being on their cancelation list as well. He was advised to call [facility] weekly to check for any cancelations so that he can get seen sooner. Patient agreeable to this plan. Patient denies any current crises at this time. He denies any SI/HI. He is aware of crisis support resources that he can utilize if ever needed. He reports that he is still actively searching for stable housing. He was encouraged to look at his email for the resources that both [facility] LCSW and [facility] BSW have sent regarding community resource support. He was appreciative of the follow up call and is agreeable to LCSW social work case closure at this time as he is now scheduled with [facility] and has been educated on [facility] resources within his area and a has a list of these to revert back to in his email if needed. Additional email sent on 07/19/23 with resources.   Patient Goals/Self-Care Activities: Take medications as prescribed   Attend all scheduled provider appointments Call pharmacy for medication refills 3-7 days in advance of running out of medications Perform all self care activities independently  Perform IADL's (shopping, preparing meals, housekeeping, managing finances) independently Call provider office for new concerns or questions Work with the social worker to address care coordination needs and will continue to work with the clinical team to address health care and disease management related needs call 1-800-273-TALK (toll free, 24 hour hotline) If in a crisis, go to [facility] [address], [place] [phone]) call 911 if  experiencing a Mental Health or Behavioral Health Crisis  Utilize healthy coping skills and supportive resources discussed Contact PCP with any questions or concerns Keep 90 percent of counseling appointments Call your insurance provider for more information about your Enhanced Benefits  Check out counseling resources provided  Begin personal counseling with LCSW, to reduce and manage symptoms of Depression and Stress, until well-established with mental health provider Accept all calls from representative with Behavioral Health Providers in an effort to establish ongoing mental health counseling and supportive services. Incorporate into daily practice - relaxation techniques, deep breathing exercises, and mindfulness meditation strategies. Talk about feelings with friends, family members, spiritual advisor, etc. Contact LCSW directly [phone]), if you have questions, need assistance, or if additional social work needs are identified between now and our next scheduled telephone outreach call. Call 988 for mental health hotline/crisis line if needed (24/7 available) Try techniques to reduce symptoms of anxiety/negative thinking (deep breathing, distraction, positive self talk, etc)  -  develop a personal safety plan - develop a plan to deal with triggers like holidays, anniversaries - exercise at least 2 to 3 times per week - have a plan for how to handle bad days - journal feelings and what helps to feel better or worse - spend time or talk with others at least 2 to 3 times per week - watch for early signs of feeling worse - begin personal counseling - call and visit an old friend - check out volunteer opportunities - join a support group - laugh; watch a funny movie or comedian - learn and use visualization or guided imagery - perform a random act of kindness - practice relaxation or meditation daily - start or continue a personal journal - practice positive thinking and  self-talk -continue with compliance of taking medication  -identify current effective and ineffective coping strategies.  -implement positive self-talk in care to increase self-esteem, confidence and feelings of control.  -consider alternative and complementary therapy approaches such as meditation, mindfulness or yoga.  Call your insurance provider to gain education on benefits if desired Call your primary care doctor if symptoms get worse  -journaling, prayer, worship services, meditation or pastoral counseling.  -increase participation in pleasurable group activities such as hobbies, singing, sports or volunteering).  -consider the use of meditative movement therapy such as tai chi, yoga or qigong.  -start a regular daily exercise program based on tolerance, ability and patient choice to support positive thinking and activity    Follow Up Plan:  The patient has been provided with contact information for the care management team and has been advised to call with any mental health or health related questions or concerns.  The care management team will reach out to the patient again over the next 30 business  days.   If you are experiencing a Mental Health or Behavioral Health Crisis or need someone to talk to, please call the Suicide and Crisis Lifeline: 54        Lyle Rung, BSW, MSW, [employer] Licensed Visual Merchandiser [employer]   [email or web address] Direct Dial: [phone]

## 2023-07-19 NOTE — Patient Instructions (Signed)
Visit Information  Karl Weaver was given information about Medicaid Managed Care team care coordination services as a part of their Healthy [facility] Medicaid benefit. Karl Weaver. verbally consented to engagement with the [facility] Managed Care team.   If you are experiencing a medical emergency, please call 911 or report to your local emergency department or urgent care.   If you have a non-emergency medical problem during routine business hours, please contact your provider's office and ask to speak with a nurse.   For questions related to your Healthy [facility] health plan, please call: [phone] or visit the homepage here: [email or web address]  If you would like to schedule transportation through your Healthy [facility] plan, please call the following number at least 2 days in advance of your appointment: [phone]  For information about your ride after you set it up, call Ride Assist at [phone]. Use this number to activate a Will Call pickup, or if your transportation is late for a scheduled pickup. Use this number, too, if you need to make a change or cancel a previously scheduled reservation.  If you need transportation services right away, call [phone]. The after-hours call center is staffed 24 hours to handle ride assistance and urgent reservation requests (including discharges) 365 days a year. Urgent trips include sick visits, hospital discharge requests and life-sustaining treatment.  Call the [facility] Line at [phone], at any time, 24 hours a day, 7 days a week. If you are in danger or need immediate medical attention call 911.  If you would like help to quit smoking, call 1-800-QUIT-NOW ((639)449-5239) OR Espaol: 1-855-Djelo-Ya (4-742-595-6387) o para ms informacin haga clic aqu or Text READY to 564-332 to register via text  Following is a copy of your plan of care:  Care Plan : LCSW Plan of  Care  Updates made by Gustavus Bryant, LCSW since 07/19/2023 12:00 AM     Problem: Coping Skills (General Plan of Care)      Goal: Coping Skills Enhanced   Start Date: 06/28/2023  Note:     Timeframe:  Short-Range Goal Priority:  High Start Date:   06/28/23        Expected End Date:  ongoing                    Goal met and closed on 07/19/23  - keep 90 percent of scheduled appointments -consider counseling or psychiatry -consider bumping up your self-care  -consider creating a stronger support network   Why is this important?             Combating depression may take some time.            If you don't feel better right away, don't give up on your treatment plan.    Current barriers:   Chronic Mental Health needs related to symptoms of PTSD, depression, stress and anxiety symptoms. Patient requires Support, Education, Resources, Referrals, Advocacy, and Care Coordination, in order to meet Unmet Mental Health Needs and to find a therapist and psychiatrist. Patient is homeless and living out of his car. SDOH needs. Communication barrier-Cannot Read or Write. Patient will implement clinical interventions discussed today to decrease symptoms of depression and increase knowledge and/or ability of: coping skills. Mental Health Concerns and Social Isolation Patient lacks knowledge of available community counseling agencies and resources.  Clinical Goal(s): verbalize understanding of plan for management of PTSD symptoms and demonstrate a reduction in symptoms. Patient will connect with a  provider for ongoing mental health treatment, increase coping skills, healthy habits, self-management skills, and stress reduction        Patient Goals/Self-Care Activities: Take medications as prescribed   Attend all scheduled provider appointments Call pharmacy for medication refills 3-7 days in advance of running out of medications Perform all self care activities independently  Perform IADL's  (shopping, preparing meals, housekeeping, managing finances) independently Call provider office for new concerns or questions Work with the social worker to address care coordination needs and will continue to work with the clinical team to address health care and disease management related needs call 1-800-273-TALK (toll free, 24 hour hotline) If in a crisis, go to [facility] [address] [place] [phone]) call 911 if experiencing a Mental Health or Behavioral Health Crisis  Utilize healthy coping skills and supportive resources discussed Contact PCP with any questions or concerns Keep 90 percent of counseling appointments Call your insurance provider for more information about your Enhanced Benefits  Check out counseling resources provided  Begin personal counseling with LCSW, to reduce and manage symptoms of Depression and Stress, until well-established with mental health provider Accept all calls from representative with Behavioral Health Providers in an effort to establish ongoing mental health counseling and supportive services. Incorporate into daily practice - relaxation techniques, deep breathing exercises, and mindfulness meditation strategies. Talk about feelings with friends, family members, spiritual advisor, etc. Contact LCSW directly [phone]), if you have questions, need assistance, or if additional social work needs are identified between now and our next scheduled telephone outreach call. Call 988 for mental health hotline/crisis line if needed (24/7 available) Try techniques to reduce symptoms of anxiety/negative thinking (deep breathing, distraction, positive self talk, etc)  - develop a personal safety plan - develop a plan to deal with triggers like holidays, anniversaries - exercise at least 2 to 3 times per week - have a plan for how to handle bad days - journal feelings and what helps to feel better or worse - spend  time or talk with others at least 2 to 3 times per week - watch for early signs of feeling worse - begin personal counseling - call and visit an old friend - check out volunteer opportunities - join a support group - laugh; watch a funny movie or comedian - learn and use visualization or guided imagery - perform a random act of kindness - practice relaxation or meditation daily - start or continue a personal journal - practice positive thinking and self-talk -continue with compliance of taking medication  -identify current effective and ineffective coping strategies.  -implement positive self-talk in care to increase self-esteem, confidence and feelings of control.  -consider alternative and complementary therapy approaches such as meditation, mindfulness or yoga.  Call your insurance provider to gain education on benefits if desired Call your primary care doctor if symptoms get worse  -journaling, prayer, worship services, meditation or pastoral counseling.  -increase participation in pleasurable group activities such as hobbies, singing, sports or volunteering).  -consider the use of meditative movement therapy such as tai chi, yoga or qigong.  -start a regular daily exercise program based on tolerance, ability and patient choice to support positive thinking and activity    Follow Up Plan:  The patient has been provided with contact information for the care management team and has been advised to call with any mental health or health related questions or concerns.  The care management team will reach out to the patient  again over the next 30 business  days.   If you are experiencing a Mental Health or Behavioral Health Crisis or need someone to talk to, please call the Suicide and Crisis Lifeline: 988        24- Hour Availability:    [facility]  [address] [place], [place] [phone] Crisis [phone]   [facility] [phone]  [place] [facility]  [phone]    [facility]  [phone] (after hours)   Therapeutic Alternative/Mobile Crisis   [phone]   [place] National Suicide Hotline  289-111-4896 [place] Child[place]) [place] [address]   Call 878-284-2666 for mental health emergencies   [employer]  [phone]);  [place] and [employer]  [phone]); [place], [place], [place], [place], [place], [place], [place]    [facility] for [facility] Residents For 24/7 walk-up access to mental health services for [facility] children (4+), adolescents and adults, please visit the [facility] located at [address] in [place], [place].  *[place] also provides comprehensive outpatient behavioral health services in a variety of locations around the [place].  Connect With [place] [address] [place], [place] [address] HelpLine: 7245178328 or 1-724-750-2948  Get Directions  Find Help 24/7 By Phone Call our 24-hour HelpLine at (952)193-6533 or 6284473756 for immediate assistance for mental health and substance abuse issues.  Walk-In Help [place]: [facility] (Ages 4 and Up) [place]: Emergency Dept., [facility] Additional Resources National Hopeline Network: 1-800-SUICIDE The National Suicide Prevention Lifeline: 1-800-273-TALK     The following coping skill education was provided for stress relief and mental health management: "When your car dies or a deadline looms, how do you respond? Long-term, low-grade or acute stress takes a serious toll on your body and mind, so don't ignore feelings of constant tension. Stress is a natural part of life. However, too much stress can harm our health, especially if it continues every day. This is chronic stress and can put you at risk for heart problems like heart disease and  depression. Understand what's happening inside your body and learn simple coping skills to combat the negative impacts of everyday stressors.  Types of Stress There are two types of stress: Emotional - types of emotional stress are relationship problems, pressure at work, financial worries, experiencing discrimination or having a major life change. Physical - Examples of physical stress include being sick having pain, not sleeping well, recovery from an injury or having an alcohol and drug use disorder. Fight or Flight Sudden or ongoing stress activates your nervous system and floods your bloodstream with adrenaline and cortisol, two hormones that raise blood pressure, increase heart rate and spike blood sugar. These changes pitch your body into a fight or flight response. That enabled our ancestors to outrun saber-toothed tigers, and it's helpful today for situations like dodging a car accident. But most modern chronic stressors, such as finances or a challenging relationship, keep your body in that heightened state, which hurts your health. Effects of Too Much Stress If constantly under stress, most of [place] will eventually start to function less well.  Multiple studies link chronic stress to a higher risk of heart disease, stroke, depression, weight gain, memory loss and even premature death, so it's important to recognize the warning signals. Talk to your doctor about ways to manage stress if you're experiencing any of  these symptoms: Prolonged periods of poor sleep. Regular, severe headaches. Unexplained weight loss or gain. Feelings of isolation, withdrawal or worthlessness. Constant anger and irritability. Loss of interest in activities. Constant worrying or obsessive thinking. Excessive alcohol or drug use. Inability to concentrate.  10 Ways to Cope with Chronic Stress It's key to recognize stressful situations as they occur because it allows you to focus on managing how you react. We all  need to know when to close our eyes and take a deep breath when we feel tension rising. Use these tips to prevent or reduce chronic stress. 1. Rebalance Work and Home All work and no play? If you're spending too much time at the office, intentionally put more dates in your calendar to enjoy time for fun, either alone or with others. 2. Get Regular Exercise Moving your body on a regular basis balances the nervous system and increases blood circulation, helping to flush out stress hormones. Even a daily 20-minute walk makes a difference. Any kind of exercise can lower stress and improve your mood ? just pick activities that you enjoy and make it a regular habit. 3. Eat Well and Limit Alcohol and Stimulants Alcohol, nicotine and caffeine may temporarily relieve stress but have negative health impacts and can make stress worse in the long run. Well-nourished bodies cope better, so start with a good breakfast, add more organic fruits and vegetables for a well-balanced diet, avoid processed foods and sugar, try herbal tea and drink more water. 4. Connect with Supportive People Talking face to face with another person releases hormones that reduce stress. Lean on those good listeners in your life. 5. Carve Out Hobby Time Do you enjoy gardening, reading, listening to music or some other creative pursuit? Engage in activities that bring you pleasure and joy; research shows that reduces stress by almost half and lowers your heart rate, too. 6. Practice Meditation, Stress Reduction or Yoga Relaxation techniques activate a state of restfulness that counterbalances your body's fight-or-flight hormones. Even if this also means a 10-minute break in a long day: listen to music, read, go for a walk in nature, do a hobby, take a bath or spend time with a friend. Also consider doing a mindfulness exercise or try a daily deep breathing or imagery practice. Deep Breathing Slow, calm and deep breathing can help you relax.  Try these steps to focus on your breathing and repeat as needed. Find a comfortable position and close your eyes. Exhale and drop your shoulders. Breathe in through your nose; fill your lungs and then your belly. Think of relaxing your body, quieting your mind and becoming calm and peaceful. Breathe out slowly through your nose, relaxing your belly. Think of releasing tension, pain, worries or distress. Repeat steps three and four until you feel relaxed. Imagery This involves using your mind to excite the senses -- sound, vision, smell, taste and feeling. This may help ease your stress. Begin by getting comfortable and then do some slow breathing. Imagine a place you love being at. It could be somewhere from your childhood, somewhere you vacationed or just a place in your imagination. Feel how it is to be in the place you're imagining. Pay attention to the sounds, air, colors, and who is there with you. This is a place where you feel cared for and loved. All is well. You are safe. Take in all the smells, sounds, tastes and feelings. As you do, feel your body being nourished and healed. Feel the calm that  surrounds you. Breathe in all the good. Breathe out any discomfort or tension. 7. Sleep Enough If you get less than seven to eight hours of sleep, your body won't tolerate stress as well as it could. If stress keeps you up at night, address the cause, and add extra meditation into your day to make up for the lost z's. Try to get seven to nine hours of sleep each night. Make a regular bedtime schedule. Keep your room dark and cool. Try to avoid computers, TV, cell phones and tablets before bed. 8. Bond with Connections You Enjoy Go out for a coffee with a friend, chat with a neighbor, call a family member, visit with a clergy member, or even hang out with your pet. Clinical studies show that spending even a short time with a companion animal can cut anxiety levels almost in half. 9. Take a  Vacation Getting away from it all can reset your stress tolerance by increasing your mental and emotional outlook, which makes you a happier, more productive person upon return. Leave your cellphone and laptop at home! 10. See a Counselor, Coach or Therapist If negative thoughts overwhelm your ability to make positive changes, it's time to seek professional help. Make an appointment today--your health and life are worth it."  Dickie La, BSW, MSW, LCSW Licensed Clinical Social Worker [employer]   [facility] [email or web address] Direct Dial: [phone]

## 2023-07-20 ENCOUNTER — Ambulatory Visit: Payer: Self-pay | Admitting: *Deleted

## 2023-07-20 ENCOUNTER — Other Ambulatory Visit: Payer: Medicaid Other

## 2023-07-20 ENCOUNTER — Ambulatory Visit: Payer: Medicaid Other | Admitting: Pediatrics

## 2023-07-20 ENCOUNTER — Other Ambulatory Visit: Payer: Self-pay

## 2023-07-20 ENCOUNTER — Encounter: Payer: Self-pay | Admitting: Pediatrics

## 2023-07-20 VITALS — BP 125/72 | HR 87 | Temp 98.7°F | Resp 15 | Wt 180.4 lb

## 2023-07-20 DIAGNOSIS — Z59819 Housing instability, housed unspecified: Secondary | ICD-10-CM

## 2023-07-20 DIAGNOSIS — L732 Hidradenitis suppurativa: Secondary | ICD-10-CM | POA: Diagnosis not present

## 2023-07-20 DIAGNOSIS — M5442 Lumbago with sciatica, left side: Secondary | ICD-10-CM

## 2023-07-20 DIAGNOSIS — G8929 Other chronic pain: Secondary | ICD-10-CM

## 2023-07-20 DIAGNOSIS — E1165 Type 2 diabetes mellitus with hyperglycemia: Secondary | ICD-10-CM | POA: Diagnosis not present

## 2023-07-20 DIAGNOSIS — Z794 Long term (current) use of insulin: Secondary | ICD-10-CM

## 2023-07-20 DIAGNOSIS — Z1330 Encounter for screening examination for mental health and behavioral disorders, unspecified: Secondary | ICD-10-CM

## 2023-07-20 DIAGNOSIS — K219 Gastro-esophageal reflux disease without esophagitis: Secondary | ICD-10-CM

## 2023-07-20 LAB — BAYER DCA HB A1C WAIVED: HB A1C (BAYER DCA - WAIVED): 10.1 % — ABNORMAL HIGH (ref 4.8–5.6)

## 2023-07-20 MED ORDER — DOXYCYCLINE HYCLATE 100 MG PO TABS: 100.0000 mg | ORAL_TABLET | Freq: Two times a day (BID) | ORAL | 0 refills | Status: DC

## 2023-07-20 MED ORDER — CLINDAMYCIN PHOSPHATE 1 % EX GEL: Freq: Two times a day (BID) | CUTANEOUS | 0 refills | Status: AC

## 2023-07-20 MED ORDER — PREGABALIN 25 MG PO CAPS: 25.0000 mg | ORAL_CAPSULE | Freq: Two times a day (BID) | ORAL | 2 refills | Status: AC

## 2023-07-20 MED ORDER — ATORVASTATIN CALCIUM 10 MG PO TABS: 10.0000 mg | ORAL_TABLET | Freq: Every day | ORAL | 3 refills | Status: AC

## 2023-07-20 MED ORDER — BENZOYL PEROXIDE 5 % EX GEL: Freq: Two times a day (BID) | CUTANEOUS | 0 refills | Status: AC

## 2023-07-20 NOTE — Assessment & Plan Note (Signed)
 He is waiting to hear back about housing resources from case management team.

## 2023-07-20 NOTE — Assessment & Plan Note (Addendum)
 Elevated A1c 10 today in clinic, currently on insulin  30units only due to cost issues. I am hoping we can get him on glp1, meeting with pharmacy today so will f/u based on her recommendations. Sent statin medication and optho referral. Needs glucometer. -Continue current insulin  regimen. -Obtain glucometer for home glucose monitoring.

## 2023-07-20 NOTE — Progress Notes (Signed)
 07/20/2023 Name: Karl Weaver. MRN: [id number] DOB: 1961/06/07  Chief Complaint  Patient presents with   Medication Assistance   [name]. is a 63 y.o. year old male who presented for a telephone visit.   They were referred to the pharmacist by their PCP for assistance in managing diabetes.   Subjective:  Care Team: Primary Care Provider: Herold Hadassah SQUIBB, MD ; Next Scheduled Visit: 07/29/23  Medication Access/Adherence  Current Pharmacy:  [id number] [place], [place] - [address] [place] [address] Phone: [phone] Fax: [phone]  -Patient reports affordability concerns with their medications: Yes  -Patient reports access/transportation concerns to their pharmacy: No  -Patient reports adherence concerns with their medications:  Yes    Diabetes: Current medications: Semglee  30 units daily -PCP would like to initiate GLP-1, such as Ozempic ; but she is concerned with insurance coverage -Most recent A1c 11.1% -Patient currently does not have home diabetic testing supplies -He endorses currently needing a refill on insulin  and pen needles-insulin  will need to be changed based on his Medicaid coverage and that plan formulary -Patient does not have stable/consistent housing, so addition of mealtime insulin  is not ideal  Medication Management: -Patient was recently seen by Dr. Herold and prescribed topical agents as well as doxycycline  for a skin and soft tissue infection-Dr. Herold wanting to verify doxycycline  will be accessible/affordable for patient due to necessity -Patient was also recently prescribed a atorvastatin  and pregabalin   Objective: Lab Results  Component Value Date   HGBA1C 11.1 (A) 06/10/2023   Lab Results  Component Value Date   CREATININE 0.99 06/10/2023   BUN 11 06/10/2023   NA 132 (L) 06/10/2023   K 4.4 06/10/2023   CL 96 06/10/2023   CO2 29 06/10/2023   Lab Results  Component Value Date   CHOL  140 06/10/2023   HDL 38.90 (L) 06/10/2023   LDLCALC 83 06/10/2023   TRIG 90.0 06/10/2023   CHOLHDL 4 06/10/2023   Medications Reviewed Today     Reviewed by Karl Weaver, RPH (Pharmacist) on 07/20/23 at 1514  Med List Status: <None>   Medication Order Taking? Sig Documenting Provider Last Dose Status Informant  atorvastatin  (LIPITOR) 10 MG tablet [id number]  Take 1 tablet (10 mg total) by mouth daily. Karl Hadassah SQUIBB, MD  Active   benzoyl peroxide  5 % gel [id number]  Apply topically 2 (two) times daily. Karl Hadassah SQUIBB, MD  Active   clindamycin  (CLINDAGEL) 1 % gel [id number]  Apply topically 2 (two) times daily. Apply to areas that are irritated and red as needed. Karl Hadassah SQUIBB, MD  Active   doxycycline  (VIBRA -TABS) 100 MG tablet [id number]  Take 1 tablet (100 mg total) by mouth 2 (two) times daily for 14 days. Karl Hadassah SQUIBB, MD  Active   famotidine  (PEPCID ) 20 MG tablet [id number] Yes Take 1 tablet (20 mg total) by mouth 2 (two) times daily. Karl Lynwood HERO, NP Taking Active   glucose blood (ACCU-CHEK GUIDE) test strip [id number] No Check sugar 2 times daily. DX E11.69  Patient not taking: Reported on 07/20/2023   Karl [place] HERO, NP Not Taking Active   pregabalin  (LYRICA ) 25 MG capsule [id number]  Take 1 capsule (25 mg total) by mouth 2 (two) times daily. Karl Hadassah SQUIBB, MD  Active   Vilazodone  HCl (VIIBRYD ) 10 MG TABS [id number]  Take 1 tablet (10 mg total) by mouth daily with breakfast for 7 days. Weaver, Saramma, MD  Active   Vilazodone  HCl (VIIBRYD ) 20 MG TABS [id number]  Take 1 tablet (20 mg total) by mouth daily with breakfast.  Patient not taking: Reported on 07/26/2022   Weaver, Saramma, MD  Active            Assessment/Plan:   Diabetes: -Currently uncontrolled -Patient's Medicaid plan will pay for Lantus  Solostar, so I am pending a prescription for this and pen needles for Dr. Herold to sign if in agreement.  Patient is aware that insulin  will be changing and endorses using  insulin  pens in the past and actually preferring these. -Ozempic  is also a preferred GLP-1 on the plan formulary; pending a prescription for this for Dr. Herold to sign.  This likely will require a prior authorization, which I we will go ahead and submit to Medicaid via cover my meds. -Pending prescriptions for diabetic testing supplies preferred on patient's formulary; these should be a $0 co-pay for the patient -I recommend the patient check blood glucose 1-2 times daily, including 1 fasting value and 1 and 2-hour postprandial value  Medication Management: -Once all [facility] are signed and [facility], I will call to verify insurance coverage and affordable co-pays and suggest alternatives if necessary  Follow Up Plan: 2/7  Karl Weaver, PharmD, DPLA

## 2023-07-20 NOTE — Progress Notes (Signed)
 Office Visit  BP 125/72 (BP Location: Left Arm, Patient Position: Sitting, Cuff Size: Large)   Pulse 87   Temp 98.7 F (37.1 C) (Oral)   Resp 15   Wt 180 lb 6.4 oz (81.8 kg)   SpO2 94%   BMI 25.34 kg/m    Subjective:    Patient ID: Karl Weaver., male    DOB: [birth year], 63 y.o.   MRN: [id number]  HPI: Karl Coleman. is a 63 y.o. male  Chief Complaint  Patient presents with   Follow-up    DM follow up. Leg pain ongoing.     Discussed the use of AI scribe software for clinical note transcription with the patient, who gave verbal consent to proceed.  History of Present Illness   Karl Aloia. is a 63 year old male with hidradenitis suppurativa who presents with a draining lesion on the inner thigh.  He has a draining lesion on the inner thigh that has been present for a couple of months. The lesion is embarrassing and problematic, as it drains and occasionally bleeds through his pants. He experiences difficulty showering, which complicates management of the lesion. He has had similar lesions in the past, including on his back and armpits, which have also drained spontaneously.  He experiences back pain and sciatica, which he rates as a 6 out of 10 in severity. He has not undergone imaging for this issue. He has a history of degenerative arthritis in his spine, which he believes contributes to his pain.  He is currently on insulin  at a dose of 30 units but does not have a way to check his blood sugar levels.  He takes medication for acid reflux, which has been less effective recently as his heartburn has worsened. He has enough medication for another month.  He is experiencing housing instability and is in contact with resources to address this issue.     Relevant past medical, surgical, family and social history reviewed and updated as indicated. Interim medical history since our last visit reviewed. Allergies and medications reviewed and  updated.  ROS per HPI unless specifically indicated above     Objective:    BP 125/72 (BP Location: Left Arm, Patient Position: Sitting, Cuff Size: Large)   Pulse 87   Temp 98.7 F (37.1 C) (Oral)   Resp 15   Wt 180 lb 6.4 oz (81.8 kg)   SpO2 94%   BMI 25.34 kg/m   Wt Readings from Last 3 Encounters:  07/20/23 180 lb 6.4 oz (81.8 kg)  07/06/23 182 lb 9.6 oz (82.8 kg)  06/10/23 184 lb 3.2 oz (83.6 kg)     Physical Exam Constitutional:      Appearance: Normal appearance.  Pulmonary:     Effort: Pulmonary effort is normal.  Musculoskeletal:        General: Normal range of motion.  Skin:    Findings: Rash present.     Comments: Left inguinal area with erythematous patch with large papule with tunneling and scarring, unable to express any pus but reports has drained on its own. Additionally seen left axilla scar (no active lesion).  Neurological:     General: No focal deficit present.     Mental Status: He is alert. Mental status is at baseline.  Psychiatric:        Mood and Affect: Mood normal.        Behavior: Behavior normal.        Thought Content:  Thought content normal.         07/20/2023    9:05 AM 07/07/2023    2:22 PM 06/10/2023   11:44 AM 07/26/2022   10:10 AM 06/24/2022    3:40 PM  Depression screen PHQ 2/9  Decreased Interest 3 1 3 2    Down, Depressed, Hopeless 2 1 3 3    PHQ - 2 Score 5 2 6 5    Altered sleeping 3 2 3 3    Tired, decreased energy 2 3 3 2    Change in appetite 2 1 2  0   Feeling bad or failure about yourself  3 1 3 3    Trouble concentrating 2 1 1 2    Moving slowly or fidgety/restless 3 1 3 1    Suicidal thoughts 2 1 3 2    PHQ-9 Score 22 12 24 18    Difficult doing work/chores Very difficult Very difficult Very difficult       Information is confidential and restricted. Go to Review Flowsheets to unlock data.       07/20/2023    9:06 AM 07/07/2023    2:24 PM 06/10/2023   11:44 AM 07/26/2022   10:12 AM  GAD 7 : Generalized Anxiety Score   Nervous, Anxious, on Edge 3 1 1 3   Control/stop worrying 2 2 2 3   Worry too much - different things 3 2  2   Trouble relaxing 3 1  3   Restless 2 1  2   Easily annoyed or irritable 2 2  3   Afraid - awful might happen 3 1  2   Total GAD 7 Score 18 10  18   Anxiety Difficulty Very difficult Very difficult  Very difficult       Assessment & Plan:  Assessment & Plan   Type 2 diabetes mellitus with hyperglycemia, with long-term current use of insulin  (HCC) Assessment & Plan: Elevated A1c 10 today in clinic, currently on insulin  30units only due to cost issues. I am hoping we can get him on glp1, meeting with pharmacy today so will f/u based on her recommendations. Sent statin medication and optho referral. Needs glucometer. -Continue current insulin  regimen. -Obtain glucometer for home glucose monitoring.  Orders: -     Bayer DCA Hb A1c Waived -     Ambulatory referral to Ophthalmology -     Atorvastatin  Calcium ; Take 1 tablet (10 mg total) by mouth daily.  Dispense: 90 tablet; Refill: 3  Hidradenitis suppurativa Assessment & Plan: Chronic, recurrent abscesses in the groin and axilla. Current abscess on the inner thigh is draining. Discussed the autoimmune nature of the condition and the importance of consistent hygiene and topical treatment. Discussed the potential need for dermatology referral if uncontrolled. -Start Doxycycline  for 14 days for suspected superimposed infection. -Start Benzoyl Peroxide  wash and Clindamycin  cream for prevention and spot treatment. -Follow-up in 2 weeks to assess response to treatment.  Orders: -     Benzoyl Peroxide ; Apply topically 2 (two) times daily.  Dispense: 45 g; Refill: 0 -     Clindamycin  Phosphate; Apply topically 2 (two) times daily. Apply to areas that are irritated and red as needed.  Dispense: 30 g; Refill: 0 -     Doxycycline  Hyclate; Take 1 tablet (100 mg total) by mouth 2 (two) times daily for 14 days.  Dispense: 28 tablet; Refill:  0  Chronic left-sided low back pain with left-sided sciatica Assessment & Plan: Ongoing pain, likely secondary to degenerative arthritis. Pain currently rated as 6/10. Has had left sided sciatica. Suspect will  take time to heal, no reliable housing so regular exercises are difficult. -Consider adding lyrica  or gabapentin pending discussion with pharmacist regarding cost and coverage. -Defer imaging at this time, reassess if pain does not improve with medication.  Orders: -     Pregabalin ; Take 1 capsule (25 mg total) by mouth 2 (two) times daily.  Dispense: 60 capsule; Refill: 2  Housing insecurity Assessment & Plan: He is waiting to hear back about housing resources from case management team.    Gastroesophageal reflux disease, unspecified whether esophagitis present Assessment & Plan: Increased heartburn symptoms despite current treatment. -Increase Omeprazole to 40mg  twice daily.   Encounter for behavioral health screening As part of their intake evaluation, the patient was screened for depression, anxiety.  PHQ9 SCORE 22, GAD7 SCORE 18. Screening results positive for tested conditions. Follows with psychiatry, unable to afford psych meds right now. CTM.  Follow up plan: Return in about 9 days (around 07/29/2023) for rash, DM.  Juanmiguel Defelice SHAUNNA NETT, MD

## 2023-07-20 NOTE — Assessment & Plan Note (Addendum)
 Ongoing pain, likely secondary to degenerative arthritis. Pain currently rated as 6/10. -Consider adding lyrica  or gabapentin pending discussion with pharmacist regarding cost and coverage. -Defer imaging at this time, reassess if pain does not improve with medication.

## 2023-07-20 NOTE — Assessment & Plan Note (Signed)
 Increased heartburn symptoms despite current treatment. -Increase Omeprazole to 40mg  twice daily.

## 2023-07-20 NOTE — Patient Instructions (Addendum)
 For your rash: - This appears to be a condition called hydradinitis supperativa. - to prevent this we typically recommend: Benzoyl peroxide  wash at least once a day For active rash, can use topical clindamycin  cream Since the rash near the groin appears to be infected, please start taking doxycycline  twice daily for 10 days. I may extend this at your follow up so I will send a course for 14 days. When I see you next week will let you know if we should keep going with it. Please try to keep the area try, use the cream as barrier  For your diabetes: - I recommend starting a cholesterol medication called atorvastatin  10mg  daily. This is recommended for all patients with diabetes - ideally we would get you on another injectable medication but our pharmacist will talk to you about that - I placed a referral for an eye doctor so you can get your diabetic eye exam done as well  For your back: - we can try lyrica  25-50mg , I am going to send the prescription but please wait to talk to our pharmacist for now to check on coverage.

## 2023-07-20 NOTE — Assessment & Plan Note (Signed)
 Chronic, recurrent abscesses in the groin and axilla. Current abscess on the inner thigh is draining. Discussed the autoimmune nature of the condition and the importance of consistent hygiene and topical treatment. Discussed the potential need for dermatology referral if uncontrolled. -Start Doxycycline  for 14 days for suspected superimposed infection. -Start Benzoyl Peroxide  wash and Clindamycin  cream for prevention and spot treatment. -Follow-up in 2 weeks to assess response to treatment.

## 2023-07-21 ENCOUNTER — Telehealth: Payer: Self-pay

## 2023-07-21 ENCOUNTER — Other Ambulatory Visit: Payer: Self-pay | Admitting: *Deleted

## 2023-07-21 MED ORDER — PEN NEEDLES 32G X 4 MM MISC: 11 refills | Status: AC

## 2023-07-21 MED ORDER — ACCU-CHEK GUIDE TEST VI STRP: ORAL_STRIP | 12 refills | Status: DC

## 2023-07-21 MED ORDER — LANTUS SOLOSTAR 100 UNIT/ML ~~LOC~~ SOPN: 30.0000 [IU] | PEN_INJECTOR | Freq: Every day | SUBCUTANEOUS | 99 refills | Status: AC

## 2023-07-21 MED ORDER — ACCU-CHEK GUIDE ME W/DEVICE KIT: PACK | 0 refills | Status: DC

## 2023-07-21 MED ORDER — OZEMPIC (0.25 OR 0.5 MG/DOSE) 2 MG/3ML ~~LOC~~ SOPN: PEN_INJECTOR | SUBCUTANEOUS | 1 refills | Status: DC

## 2023-07-21 MED ORDER — ACCU-CHEK SOFTCLIX LANCETS MISC: 12 refills | Status: DC

## 2023-07-21 NOTE — Patient Outreach (Signed)
 Medicaid Managed Care   Nurse Care Manager Note  07/21/2023 Name:  Karl Weaver. MRN:  [id number] DOB:  01-Nov-1960  Karl Belvie [name]. is an 64 y.o. year old male who is a primary patient of Herold, Hadassah SQUIBB, MD.  [facility] Managed Care Coordination team was consulted for assistance with:    DMII  [name] was given information about Medicaid Managed Care Coordination team services today. Karl Belvie [name]. Patient agreed to services and verbal consent obtained.  Engaged with patient by telephone for follow up visit in response to provider referral for case management and/or care coordination services.   Patient is participating in a Managed Medicaid Plan:  Yes  Assessments/Interventions:  Review of past medical history, allergies, medications, health status, including review of consultants reports, laboratory and other test data, was performed as part of comprehensive evaluation and provision of chronic care management services.  SDOH (Social Drivers of Health) assessments and interventions performed: SDOH Interventions    Flowsheet Row Office Visit from 07/06/2023 in [facility] Patient Outreach Telephone from 06/28/2023 in [place] [facility]PARTMENT Patient Outreach Telephone from 06/20/2023 in  POPULATION HEALTH DEPARTMENT Office Visit from 06/10/2023 in [facility]  SDOH Interventions      Food Insecurity Interventions -- -- Other (Comment)  [BSW referral] --  Housing Interventions -- [employer] Provided Other (Comment)  [BSW referral-scheduled on 06/24/23] --  Transportation Interventions -- -- Intervention Not Indicated --  Utilities Interventions -- -- Other (Comment)  [BSW referral] --  Alcohol Usage Interventions Intervention Not Indicated (Score <7) -- -- --  Depression Interventions/Treatment  Community Resources Provided -- -- Referral to Psychiatry  Financial  Strain Interventions -- [employer] Provided -- --  Stress Interventions -- Provide Counseling, [employer] Provided -- --  Health Literacy Interventions Intervention Not Indicated -- -- --       Care Plan  Allergies  Allergen Reactions   Pork Allergy Anaphylaxis   Codeine Itching   Cyclobenzaprine  Other (See Comments)    Other reaction(s): Other (See Comments) Increased blood sugar and irritability  Increased blood sugar and irritability     Diclofenac Sodium Other (See Comments)    Other reaction(s): Other (See Comments) Gi gas  Gi gas     Esomeprazole Magnesium Other (See Comments)    Other reaction(s): Other (See Comments) Increased acid  Increased acid     Nsaids Other (See Comments)    Increased blood sugar   Amitriptyline Rash    Other reaction(s): Other (See Comments) jittery jittery     Medications Reviewed Today     Reviewed by Lucky Andrea LABOR, RN (Registered Nurse) on 07/21/23 at [phone]  Med List Status: <None>   Medication Order Taking? Sig Documenting Provider Last Dose Status Informant  atorvastatin  (LIPITOR) 10 MG tablet [id number] No Take 1 tablet (10 mg total) by mouth daily.  Patient not taking: Reported on 07/21/2023   Herold Hadassah SQUIBB, MD Not Taking Active   benzoyl peroxide  5 % gel [id number] No Apply topically 2 (two) times daily.  Patient not taking: Reported on 07/21/2023   Herold Hadassah SQUIBB, MD Not Taking Active   clindamycin  (CLINDAGEL) 1 % gel [id number] No Apply topically 2 (two) times daily. Apply to areas that are irritated and red as needed.  Patient not taking: Reported on 07/21/2023   Herold Hadassah SQUIBB, MD Not Taking Active   doxycycline  (VIBRA -TABS) 100 MG tablet [id number]  No Take 1 tablet (100 mg total) by mouth 2 (two) times daily for 14 days.  Patient not taking: Reported on 07/21/2023   Herold Hadassah SQUIBB, MD Not Taking Active   famotidine  (PEPCID ) 20 MG tablet [id number] Yes Take 1 tablet (20 mg total) by mouth 2 (two) times  daily. Wendee Lynwood HERO, NP Taking Active   pregabalin  (LYRICA ) 25 MG capsule [id number] No Take 1 capsule (25 mg total) by mouth 2 (two) times daily.  Patient not taking: Reported on 07/21/2023   Herold Hadassah SQUIBB, MD Not Taking Active             Patient Active Problem List   Diagnosis Date Noted   Hidradenitis suppurativa 07/20/2023   Housing insecurity 06/10/2023   Tobacco abuse 06/10/2023   Opioid use disorder, moderate, in sustained remission (HCC) 06/24/2022   Cocaine use disorder, moderate, in sustained remission (HCC) 06/24/2022   Alcohol use disorder, moderate, in sustained remission (HCC) 06/24/2022   Current moderate episode of major depressive disorder (HCC) 04/23/2022   PTSD (post-traumatic stress disorder) 12/29/2020   Erectile dysfunction 01/04/2020   Chronic pain of left knee 08/27/2014   OSA on CPAP 08/27/2014   Seasonal allergies 08/27/2014   Chronic low back pain 03/29/2014   COPD (chronic obstructive pulmonary disease) (HCC) 03/29/2014   Hyperlipidemia 03/29/2014   Type 2 diabetes mellitus with hyperglycemia, with long-term current use of insulin  (HCC) 03/20/2014   GERD (gastroesophageal reflux disease) 03/20/2014   Hypertension associated with diabetes (HCC) 03/20/2014    Conditions to be addressed/monitored per PCP order:  DMII  Care Plan : RN Care Manager Plan of Care  Updates made by Lucky Andrea LABOR, RN since 07/21/2023 12:00 AM     Problem: Health Managment needs related to DMII      Long-Range Goal: Development of Plan of Care to address Health Managment needs related to DMII   Start Date: 06/20/2023  Expected End Date: 09/18/2023  Note:   Current Barriers:  Chronic Disease Management support and education needs related to DMII   RNCM Clinical Goal(s):  Patient will verbalize understanding of plan for management of DMII as evidenced by patient reports take all medications exactly as prescribed and will call provider for medication related questions as  evidenced by patient reports attend all scheduled medical appointments: Pharmacy on 07/22/23, 07/26/23 with BSW and 07/29/23 with PCP as evidenced by provider documentation in EMR work with child psychotherapist to address  related to the management of Limited access to food, Housing barriers, and Medication procurement related to the management of DMII as evidenced by review of EMR and patient or child psychotherapist report through collaboration with Medical Illustrator, provider, and care team.   Interventions: Evaluation of current treatment plan related to  self management and patient's adherence to plan as established by provider   Diabetes Interventions:  (Status:  Goal on track:  Yes.) Long Term Goal Assessed patient's understanding of A1c goal: <7% Provided education to patient about basic DM disease process Reviewed medications with patient and discussed importance of medication adherence Discussed plans with patient for ongoing care management follow up and provided patient with direct contact information for care management team Reviewed scheduled/upcoming provider appointments including: Pharmacy 07/22/23, BSW on 07/26/23 and New PCP on 07/29/23 Review of patient status, including review of consultants reports, relevant laboratory and other test results, and medications completed Assessed social determinant of health barriers Reviewed member benefits provided by [facility], provided member services line [phone] Provided patient  with [employer] medical transportation [phone] Discussed having prescriptions transferred to [facility] [phone] and requesting a charge account Advised patient to pick up new prescriptions and take as directed, continue working with Pharmacist Ensured patient working with LCSW and BSW Discussed healthy food choices-patient now receiving food benefits and is able to purchase healthier food options Advised patient to follow up on  referral for Eye exam Lab Results  Component Value Date   HGBA1C 10.1 (H) 07/20/2023    Patient Goals/Self-Care Activities: Take all medications as prescribed Call provider office for new concerns or questions  Work with the social worker to address care coordination needs and will continue to work with the clinical team to address health care and disease management related needs  Follow Up Plan:  Telephone follow up appointment with care management team member scheduled for:  08/23/23 at 9am      Follow Up:  Patient agrees to Care Plan and Follow-up.  Plan: The Managed Medicaid care management team will reach out to the patient again over the next 30 days.  Date/time of next scheduled RN care management/care coordination outreach:  08/23/23 at 9am  Andrea Dimes RN, BSN [place]  [facility] RN Care Manager [phone]

## 2023-07-21 NOTE — Progress Notes (Signed)
   07/21/2023  Patient ID: Karl Weaver., male   DOB: 20-Jan-1961, 63 y.o.   MRN: [id number]  Contacted [facility] to verify coverage/copay for medications sent in this week.  Glucometer- filled- $15, but pharmacy is trying to get free Medicaid voucher to apply- ready for pick up Strips- filled- $0 and ready for pick up Lancets-filled- $0 but had to be ordered Benzoyl Peroxide  gel- not covered, so prescription was placed on hold- can get OTC Clindamycin  gel- filled- $4 copay and ready for pick up Doxycycline - filled- $4 copay and ready for pick up Lantus - filled- $4 copay but had to be ordered  Pen needles- filled- $4 copay and ready for pick up Pregabalin - filled- $4 copay and ready for pick up Ozempic - filled- $4 copay and ready for pick up Atorvastatin - filled- $4 copay and ready for pick up  Telephone follow-up with patient tomorrow.  Channing DELENA Mealing, PharmD, DPLA

## 2023-07-21 NOTE — Patient Instructions (Signed)
 Visit Information  [name] was given information about Medicaid Managed Care team care coordination services as a part of their Healthy [facility] Medicaid benefit. Karl Weaver. verbally consented to engagement with the [facility] Managed Care team.   If you are experiencing a medical emergency, please call 911 or report to your local emergency department or urgent care.   If you have a non-emergency medical problem during routine business hours, please contact your provider's office and ask to speak with a nurse.   For questions related to your Healthy [facility] health plan, please call: [phone] or visit the homepage here: [email or web address]  If you would like to schedule transportation through your Healthy [facility] plan, please call the following number at least 2 days in advance of your appointment: [phone]  For information about your ride after you set it up, call Ride Assist at [phone]. Use this number to activate a Will Call pickup, or if your transportation is late for a scheduled pickup. Use this number, too, if you need to make a change or cancel a previously scheduled reservation.  If you need transportation services right away, call [phone]. The after-hours call center is staffed 24 hours to handle ride assistance and urgent reservation requests (including discharges) 365 days a year. Urgent trips include sick visits, hospital discharge requests and life-sustaining treatment.  Call the [facility] Line at [phone], at any time, 24 hours a day, 7 days a week. If you are in danger or need immediate medical attention call 911.  If you would like help to quit smoking, call 1-800-QUIT-NOW (365-798-5703) OR Espaol: 1-855-Djelo-Ya (8-144-664-6430) o para ms informacin haga clic aqu or Text READY to 799-599 to register via text  Karl Weaver,   Please see education materials related to resources  provided as print materials.   The patient verbalized understanding of instructions, educational materials, and care plan provided today and agreed to receive a mailed copy of patient instructions, educational materials, and care plan.   Telephone follow up appointment with Managed Medicaid care management team member scheduled for:08/23/23  Andrea Dimes RN, BSN [place]  Value-Based Care [facility] RN Care Manager [phone]   Following is a copy of your plan of care:  Care Plan : RN Care Manager Plan of Care  Updates made by Dimes Andrea LABOR, RN since 07/21/2023 12:00 AM     Problem: Health Managment needs related to DMII      Long-Range Goal: Development of Plan of Care to address Health Managment needs related to DMII   Start Date: 06/20/2023  Expected End Date: 09/18/2023  Note:   Current Barriers:  Chronic Disease Management support and education needs related to DMII   RNCM Clinical Goal(s):  Patient will verbalize understanding of plan for management of DMII as evidenced by patient reports take all medications exactly as prescribed and will call provider for medication related questions as evidenced by patient reports attend all scheduled medical appointments: Pharmacy on 07/22/23, 07/26/23 with BSW and 07/29/23 with PCP as evidenced by provider documentation in EMR work with social worker to address  related to the management of Limited access to food, Housing barriers, and Medication procurement related to the management of DMII as evidenced by review of EMR and patient or child psychotherapist report through collaboration with Medical Illustrator, provider, and care team.   Interventions: Evaluation of current treatment plan related to  self management and patient's adherence to plan as established by provider  Diabetes Interventions:  (Status:  Goal on track:  Yes.) Long Term Goal Assessed patient's understanding of A1c goal: <7% Provided education to patient about  basic DM disease process Reviewed medications with patient and discussed importance of medication adherence Discussed plans with patient for ongoing care management follow up and provided patient with direct contact information for care management team Reviewed scheduled/upcoming provider appointments including: Pharmacy 07/22/23, BSW on 07/26/23 and New PCP on 07/29/23 Review of patient status, including review of consultants reports, relevant laboratory and other test results, and medications completed Assessed social determinant of health barriers Reviewed member benefits provided by [facility], provided member services line [phone] Provided patient with [employer] medical transportation [phone] Discussed having prescriptions transferred to [facility] [phone] and requesting a charge account Advised patient to pick up new prescriptions and take as directed, continue working with Pharmacist Ensured patient working with [employer] and BSW Discussed healthy food choices-patient now receiving food benefits and is able to purchase healthier food options Advised patient to follow up on referral for Eye exam Lab Results  Component Value Date   HGBA1C 10.1 (H) 07/20/2023    Patient Goals/Self-Care Activities: Take all medications as prescribed Call provider office for new concerns or questions  Work with the social worker to address care coordination needs and will continue to work with the clinical team to address health care and disease management related needs  Follow Up Plan:  Telephone follow up appointment with care management team member scheduled for:  08/23/23 at [facility] are some resources for your county of residence, I hope you find this information helpful. If you have any further questions, please call me at the number listed below.  If you are homeless or at risk of becoming homeless, please call the Coordinated  Assessment in-take line, [phone] Agency Name:     [facility]   Address:       [address] [place], [place] [address]   Phone:       [phone]   Email:       [email or web address]   Website:       [email or web address]   Service(s) Offered:     Housing services, self-sufficiency, congregate meal program, weatherization program, event organiser program, emergency food assistance, housing counseling, home ownership program, wheels -towork program.   Agency Name:     [facility]   Address:       [address] [place], [place] [address]   Phone:       [phone] (8a-4p) [phone] (8p- 10p)   Email:       [email or web address]   Website:       [email or web address]   Service(s) Offered:    A program for homeless and/or needy men that includes           one-on-one counseling, life skills training and job               rehabilitation.   Agency Name:     [facility]   Address:       [address], [place], [place] [address]   Phone:       [phone]   Website:       [email or web address]   Service(s) Offered:   Assistance to needy in emergency with utility bills, heating fuel, and prescriptions. Shelter for homeless 7pm-7am.

## 2023-07-22 ENCOUNTER — Other Ambulatory Visit: Payer: Self-pay

## 2023-07-22 NOTE — Progress Notes (Unsigned)
   07/22/2023  Patient ID: [name]., male   DOB: 01-08-1961, 64 y.o.   MRN: [id number]  Outreach attempt for telephone follow-up in regard to medication access/affordability/diabetes was not successful, but I was able to leave HIPAA compliant voicemail with my direct phone number.  If I do not hear back, I will try to call the patient again next week.  Channing DELENA Mealing, PharmD, DPLA
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
## 2023-07-25 ENCOUNTER — Ambulatory Visit: Payer: Medicaid Other | Admitting: Psychiatry

## 2023-07-26 ENCOUNTER — Other Ambulatory Visit: Payer: Self-pay

## 2023-07-26 NOTE — Patient Instructions (Signed)
Visit Information  [name] was given information about Medicaid Managed Care team care coordination services as a part of their Healthy [facility] Medicaid benefit. Karl Weaver. verbally consented to engagement with the [facility] Managed Care team.   If you are experiencing a medical emergency, please call 911 or report to your local emergency department or urgent care.   If you have a non-emergency medical problem during routine business hours, please contact your provider's office and ask to speak with a nurse.   For questions related to your Healthy [facility] health plan, please call: [phone] or visit the homepage here: [email or web address]  If you would like to schedule transportation through your Healthy [facility] plan, please call the following number at least 2 days in advance of your appointment: [phone]  For information about your ride after you set it up, call Ride Assist at [phone]. Use this number to activate a Will Call pickup, or if your transportation is late for a scheduled pickup. Use this number, too, if you need to make a change or cancel a previously scheduled reservation.  If you need transportation services right away, call [phone]. The after-hours call center is staffed 24 hours to handle ride assistance and urgent reservation requests (including discharges) 365 days a year. Urgent trips include sick visits, hospital discharge requests and life-sustaining treatment.  Call the [facility] Line at [phone], at any time, 24 hours a day, 7 days a week. If you are in danger or need immediate medical attention call 911.  If you would like help to quit smoking, call 1-800-QUIT-NOW (415-338-2908) OR Espaol: 1-855-Djelo-Ya (8-756-433-2951) o para ms informacin haga clic aqu or Text READY to 884-166 to register via text  [name] - following are the goals we discussed in your visit today:    Goals Addressed   None      Social Worker will follow up in 30 days.   Karl Weaver, Karl Weaver, MHA [facility]  Managed Medicaid Social Worker [phone]   Following is a copy of your plan of care:  There are no care plans that you recently modified to display for this patient.

## 2023-07-26 NOTE — Progress Notes (Signed)
   07/26/2023  Patient ID: Karl Media., male   DOB: Aug 29, 1960, 63 y.o.   MRN: [id number]  Subjective/objective Patient out reach to follow back up with Karl Weaver in regard to prescriptions filled by [facility] recently  Medication management -Patient states he was able to get all prescriptions except for a atorvastatin, benzoyl peroxide gel, and the Accu-Chek guide glucometer -Benzoyl peroxide gel was not covered by insurance and neither was the glucometer -By the time he paid the for dollar Medicaid co-pays for all other prescriptions, he did not have funds left for a atorvastatin  Assessment/plan  Medication management -Contacted [facility] to provide [place] Medicaid free meter billing information, but program is rejecting payment stating patient has received a free meter in the past 2 years; so they will not cover another at this time.  Without insurance, meter would be approximately $15.  Contacting PCP office to see if they happen to have any Accu-Chek guide meters, and contacting my teammates to see if they know of other ways to obtain for patients. -Inquired if [facility] is able to waive Medicaid co-pays or placed on a charge account on occasion if patients cannot afford all medications, and they are not able to.  [place] pharmacies will allow Medicaid patients to charge co-pays up to a certain amount so long as monthly payments are made-I will discuss this further with the patient tomorrow.   Follow-up: Tomorrow  Lenna Gilford, PharmD, DPLA

## 2023-07-26 NOTE — Patient Outreach (Signed)
Medicaid Managed Care Social Work Note  07/26/2023 Name:  Karl Weaver. MRN:  [id number] DOB:  [birth year]  Karl Weaver. is an 63 y.o. year old male who is a primary patient of Evelene Croon, Atilano Median, MD.  The Medicaid Managed Care Coordination team was consulted for assistance with:   housing  Karl Weaver was given information about Medicaid Managed Care Coordination team services today. Karl Weaver. Patient agreed to services and verbal consent obtained.  Engaged with patient  for by telephone forfollow up visit in response to referral for case management and/or care coordination services.   Patient is participating in a Managed Medicaid Plan:  Yes  Assessments/Interventions:  Review of past medical history, allergies, medications, health status, including review of consultants reports, laboratory and other test data, was performed as part of comprehensive evaluation and provision of chronic care management services.  SDOH: (Social Drivers of Health) assessments and interventions performed: SDOH Interventions    Flowsheet Row Office Visit from 07/06/2023 in [facility] Patient Outreach Telephone from 06/28/2023 in [place] [facility]PART[facility] Patient Outreach Telephone from 06/20/2023 in [place] POPULATION HEALTH DEPARTMENT Office Visit from 06/10/2023 in [facility] [place]  SDOH Interventions      Food Insecurity Interventions -- -- Other (Comment)  [BSW referral] --  Housing Interventions -- [employer] Provided Other (Comment)  [BSW referral-scheduled on 06/24/23] --  Transportation Interventions -- -- Intervention Not Indicated --  Utilities Interventions -- -- Other (Comment)  [BSW referral] --  Alcohol Usage Interventions Intervention Not Indicated (Score <7) -- -- --  Depression Interventions/Treatment  Community Resources Provided -- -- Referral to Psychiatry  Financial Strain  Interventions -- [employer] Provided -- --  Stress Interventions -- Provide Counseling, [employer] Provided -- --  Health Literacy Interventions Intervention Not Indicated -- -- --     BSW completed a telephone outreach with patient, he stated he did not receive the housing resources BSW sent. BSW and patient agreed for resources to be resent. No other resources are needed at this time.   Advanced Directives Status:  Not addressed in this encounter.  Care Plan                 Allergies  Allergen Reactions   Pork Allergy Anaphylaxis   Codeine Itching   Cyclobenzaprine Other (See Comments)    Other reaction(s): Other (See Comments) Increased blood sugar and irritability  Increased blood sugar and irritability     Diclofenac Sodium Other (See Comments)    Other reaction(s): Other (See Comments) Gi gas  Gi gas     Esomeprazole Magnesium Other (See Comments)    Other reaction(s): Other (See Comments) Increased acid  Increased acid     Nsaids Other (See Comments)    Increased blood sugar   Amitriptyline Rash    Other reaction(s): Other (See Comments) jittery jittery     Medications Reviewed Today   Medications were not reviewed in this encounter     Patient Active Problem List   Diagnosis Date Noted   Hidradenitis suppurativa 07/20/2023   Housing insecurity 06/10/2023   Tobacco abuse 06/10/2023   Opioid use disorder, moderate, in sustained remission (HCC) 06/24/2022   Cocaine use disorder, moderate, in sustained remission (HCC) 06/24/2022   Alcohol use disorder, moderate, in sustained remission (HCC) 06/24/2022   Current moderate episode of major depressive disorder (HCC) 04/23/2022   PTSD (post-traumatic stress disorder) 12/29/2020  Erectile dysfunction 01/04/2020   Chronic pain of left knee 08/27/2014   OSA on CPAP 08/27/2014   Seasonal allergies 08/27/2014   Chronic low back pain 03/29/2014   COPD (chronic obstructive pulmonary disease) (HCC)  03/29/2014   Hyperlipidemia 03/29/2014   Type 2 diabetes mellitus with hyperglycemia, with long-term current use of insulin (HCC) 03/20/2014   GERD (gastroesophageal reflux disease) 03/20/2014   Hypertension associated with diabetes (HCC) 03/20/2014    Conditions to be addressed/monitored per PCP order:   housing  There are no care plans that you recently modified to display for this patient.   Follow up:  Patient agrees to Care Plan and Follow-up.  Plan: The Managed Medicaid care management team will reach out to the patient again over the next 30 days.  Date/time of next scheduled Social Work care management/care coordination outreach:  08/23/23  Karl Weaver, Karl Weaver, [facility]  Managed [facility] Social Worker [phone]

## 2023-07-27 ENCOUNTER — Other Ambulatory Visit: Payer: Self-pay

## 2023-07-27 NOTE — Progress Notes (Unsigned)
   07/27/2023  Patient ID: Karl Media., male   DOB: 1960-09-20, 63 y.o.   MRN: [id number]  I was able to obtain a free voucher for Accu-Chek guide meter through the manufacturer.  I called [facility] and provided the billing information, but the meter has to be ordered and will be in tomorrow afternoon.  I will call [facility] again tomorrow afternoon to verify that the meter is there and going through at no charge for the patient.  I will then contact the patient to inform, discuss filling at least his a atorvastatin with a [facility] pharmacy, and schedule a follow-up visit to check-in on blood glucose readings.  Lenna Gilford, PharmD, DPLA

## 2023-07-28 ENCOUNTER — Other Ambulatory Visit: Payer: Self-pay

## 2023-07-28 MED ORDER — FREESTYLE LIBRE 3 READER DEVI
0 refills | Status: AC
Start: 2023-07-28 — End: ?

## 2023-07-28 MED ORDER — FREESTYLE LIBRE 3 PLUS SENSOR MISC: 12 refills | Status: AC

## 2023-07-28 NOTE — Progress Notes (Signed)
   07/28/2023  Patient ID: [name]., male   DOB: January 03, 1961, 63 y.o.   MRN: [id number]  Subjective/objective Patient out reach to follow-up on accessibility and affordability of medications  Medication access/affordability -Informed patient that Medicaid will only pay for 1 glucometer every 2 years, and plan states they have already paid for 1 and this time; so he would have to pay $15 for the Accu-Chek guide glucometer at this time -Discussed the possibility of getting a CGM such as libre 3 or Dexcom G7 covered, and patient would prefer this over fingersticks to check blood glucose -Patient has not yet been able to pick up his a atorvastatin; so I once again offered filling with a [facility] pharmacy to be able to charge his Medicaid co-pay  Assessment/plan  Medication access/affordability -Prescription for libre 3 and libre 3+ sensors sent [facility] under [facility] standing order for CGM -Patient states he has recently started helping out a friend and will be making money to be able to afford medication co-pay; so hopefully he can pick up his a atorvastatin in the near future  Follow-up: Will contact [facility] to verify coverage of libre 3 and notify patient  Lenna Gilford, PharmD, DPLA

## 2023-07-29 ENCOUNTER — Ambulatory Visit (INDEPENDENT_AMBULATORY_CARE_PROVIDER_SITE_OTHER): Payer: Medicaid Other | Admitting: Pediatrics

## 2023-07-29 ENCOUNTER — Encounter: Payer: Self-pay | Admitting: Pediatrics

## 2023-07-29 VITALS — BP 136/75 | HR 87 | Temp 97.7°F | Resp 14 | Ht 70.75 in | Wt 181.2 lb

## 2023-07-29 DIAGNOSIS — E1165 Type 2 diabetes mellitus with hyperglycemia: Secondary | ICD-10-CM

## 2023-07-29 DIAGNOSIS — Z794 Long term (current) use of insulin: Secondary | ICD-10-CM

## 2023-07-29 DIAGNOSIS — L732 Hidradenitis suppurativa: Secondary | ICD-10-CM

## 2023-07-29 MED ORDER — DOXYCYCLINE HYCLATE 100 MG PO TABS: 100.0000 mg | ORAL_TABLET | Freq: Two times a day (BID) | ORAL | 0 refills | Status: AC

## 2023-07-29 NOTE — Progress Notes (Signed)
 Office Visit  BP 136/75 (BP Location: Left Arm, Patient Position: Sitting, Cuff Size: Normal)   Pulse 87   Temp 97.7 F (36.5 C) (Oral)   Resp 14   Ht 5' 10.75" (1.797 m)   Wt 181 lb 3.2 oz (82.2 kg)   SpO2 96%   BMI 25.45 kg/m    Subjective:    Patient ID: Karl Media., male    DOB: 1961/03/23, 63 y.o.   MRN: [id number]  HPI: Karl Mansfield. is a 63 y.o. male  Chief Complaint  Patient presents with   Diabetes    Ozempic, not sure hot to use the pen needle   Rash    Groin and feels its getting better    Discussed the use of AI scribe software for clinical note transcription with the patient, who gave verbal consent to proceed.  History of Present Illness   Karl Mccrone. is a 63 year old male who presents with difficulty injecting Ozempic and ongoing treatment for a rash.  He is experiencing difficulty with injecting Ozempic. The medication runs down his skin during administration, indicating improper delivery. He has attempted injections in various areas, including his arm, but has not felt the medication being delivered. Despite being aware of the correct technique, including holding the pen in place for ten seconds, he continues to encounter issues.  He is currently on a course of doxycycline for a rash, with one or two days remaining on the medication. The treatment includes the use of a cream, and there is a possibility of requiring a longer course of doxycycline if necessary.  He has been in contact with Elnita Maxwell regarding obtaining a sensor, and there is a plan in place for this. He also mentions a recent difficulty in locating a therapy appointment, despite arriving early and receiving directions over the phone.     Relevant past medical, surgical, family and social history reviewed and updated as indicated. Interim medical history since our last visit reviewed. Allergies and medications reviewed and updated.  ROS per HPI unless  specifically indicated above     Objective:    BP 136/75 (BP Location: Left Arm, Patient Position: Sitting, Cuff Size: Normal)   Pulse 87   Temp 97.7 F (36.5 C) (Oral)   Resp 14   Ht 5' 10.75" (1.797 m)   Wt 181 lb 3.2 oz (82.2 kg)   SpO2 96%   BMI 25.45 kg/m   Wt Readings from Last 3 Encounters:  07/29/23 181 lb 3.2 oz (82.2 kg)  07/20/23 180 lb 6.4 oz (81.8 kg)  07/06/23 182 lb 9.6 oz (82.8 kg)     Physical Exam Constitutional:      Appearance: Normal appearance.  Pulmonary:     Effort: Pulmonary effort is normal.  Musculoskeletal:        General: Normal range of motion.  Skin:    Comments: Normal skin color  Neurological:     General: No focal deficit present.     Mental Status: He is alert. Mental status is at baseline.  Psychiatric:        Mood and Affect: Mood normal.        Behavior: Behavior normal.        Thought Content: Thought content normal.         07/29/2023   11:04 AM 07/20/2023    9:05 AM 07/07/2023    2:22 PM 06/10/2023   11:44 AM 07/26/2022   10:10 AM  Depression screen PHQ 2/9  Decreased Interest 3 3 1 3 2   Down, Depressed, Hopeless 3 2 1 3 3   PHQ - 2 Score 6 5 2 6 5   Altered sleeping 3 3 2 3 3   Tired, decreased energy 2 2 3 3 2   Change in appetite 3 2 1 2  0  Feeling bad or failure about yourself  3 3 1 3 3   Trouble concentrating 3 2 1 1 2   Moving slowly or fidgety/restless 2 3 1 3 1   Suicidal thoughts 3 2 1 3 2   PHQ-9 Score 25 22 12 24 18   Difficult doing work/chores Very difficult Very difficult Very difficult Very difficult        07/20/2023    9:06 AM 07/07/2023    2:24 PM 06/10/2023   11:44 AM 07/26/2022   10:12 AM  GAD 7 : Generalized Anxiety Score  Nervous, Anxious, on Edge 3 1 1 3   Control/stop worrying 2 2 2 3   Worry too much - different things 3 2  2   Trouble relaxing 3 1  3   Restless 2 1  2   Easily annoyed or irritable 2 2  3   Afraid - awful might happen 3 1  2   Total GAD 7 Score 18 10  18   Anxiety Difficulty Very  difficult Very difficult  Very difficult       Assessment & Plan:  Assessment & Plan   Type 2 diabetes mellitus with hyperglycemia, with long-term current use of insulin (HCC) Assessment & Plan: Following with our clinical pharmacist. Started on ozempic. Patient experiencing difficulty with self-injection, with medication running down skin instead of being absorbed. -Plan to have an assistant demonstrate proper injection technique today. -Continue Ozempic as it is a good long-term medication option for this patient.   Hidradenitis suppurativa Assessment & Plan: Currently on antibiotics (Doxycycline) and topical cream. Patient has 1-2 days left of antibiotics.  Limited by financial resources to afford medications. Per patient, active rash improving but still present. Discussed possibility of needing a longer course, will extend for 2 more weeks. Will consider 3 month therapy at follow up. -Continue current treatment plan. -Reevaluate rash in 2 weeks.  Orders: -     Doxycycline Hyclate; Take 1 tablet (100 mg total) by mouth 2 (two) times daily for 14 days.  Dispense: 28 tablet; Refill: 0    Follow up plan: Return in about 2 weeks (around 08/12/2023) for rash.  Jackolyn Confer, MD

## 2023-07-29 NOTE — Progress Notes (Signed)
   07/29/2023  Patient ID: Karl Weaver., male   DOB: 10/23/1960, 63 y.o.   MRN: [id number]  Subjective/Objective Patient outreach to follow-up on management of T2DM  Diabetes -Current medications: Ozempic 0.25 mg weekly -Patient was seen by Dr. Evelene Croon today, and he was educated on use of Ozempic -Informed patient that libre 3 reader and sensors have been approved by insurance and are going through as $0 co-pay.  Patient plans to pick up and begin using for CGM. -Patient states he is not using prescribed Lantus -most recent A1c on 07/20/2023 was 10.1%  Assessment/Plan  Diabetes -Currently uncontrolled -Continue Ozempic 0.25 mg weekly for 4 weeks, then we will increase to 0.5 mg weekly if patient is tolerating well -Goal to titrate to Ozempic 1 mg an additional 4 weeks and 2 mg in another 4 weeks -Recommend follow-up A1c the beginning of May.  If this is still  > 7% but <9%, we could consider the addition of an SGLT2  Follow up:  4 weeks  Lenna Gilford, PharmD, DPLA

## 2023-07-29 NOTE — Patient Instructions (Signed)
I am sending two more weeks of doxycycline to cover you until our next visit.   For psychiatry: [facility] [address] [place], [place] [address]

## 2023-08-07 ENCOUNTER — Encounter: Payer: Self-pay | Admitting: Pediatrics

## 2023-08-07 NOTE — Assessment & Plan Note (Addendum)
 Currently on antibiotics (Doxycycline) and topical cream. Patient has 1-2 days left of antibiotics.  Limited by financial resources to afford medications. Per patient, active rash improving but still present. Discussed possibility of needing a longer course, will extend for 2 more weeks. Will consider 3 month therapy at follow up. -Continue current treatment plan. -Reevaluate rash in 2 weeks.

## 2023-08-07 NOTE — Assessment & Plan Note (Signed)
 Following with our clinical pharmacist. Started on ozempic. Patient experiencing difficulty with self-injection, with medication running down skin instead of being absorbed. -Plan to have an assistant demonstrate proper injection technique today. -Continue Ozempic as it is a good long-term medication option for this patient.

## 2023-08-17 ENCOUNTER — Encounter: Payer: Self-pay | Admitting: Pediatrics

## 2023-08-17 ENCOUNTER — Ambulatory Visit: Payer: Medicaid Other | Admitting: Pediatrics

## 2023-08-17 VITALS — BP 99/63 | HR 92 | Temp 97.6°F | Resp 18 | Wt 176.8 lb

## 2023-08-17 DIAGNOSIS — Z794 Long term (current) use of insulin: Secondary | ICD-10-CM

## 2023-08-17 DIAGNOSIS — Z1330 Encounter for screening examination for mental health and behavioral disorders, unspecified: Secondary | ICD-10-CM | POA: Diagnosis not present

## 2023-08-17 DIAGNOSIS — M1990 Unspecified osteoarthritis, unspecified site: Secondary | ICD-10-CM

## 2023-08-17 DIAGNOSIS — L732 Hidradenitis suppurativa: Secondary | ICD-10-CM

## 2023-08-17 DIAGNOSIS — M5442 Lumbago with sciatica, left side: Secondary | ICD-10-CM | POA: Diagnosis not present

## 2023-08-17 DIAGNOSIS — E1165 Type 2 diabetes mellitus with hyperglycemia: Secondary | ICD-10-CM | POA: Diagnosis not present

## 2023-08-17 MED ORDER — OZEMPIC (0.25 OR 0.5 MG/DOSE) 2 MG/3ML ~~LOC~~ SOPN: 0.5000 mg | PEN_INJECTOR | SUBCUTANEOUS | 0 refills | Status: DC

## 2023-08-17 MED ORDER — METHYLPREDNISOLONE 4 MG PO TBPK: ORAL_TABLET | ORAL | 0 refills | Status: AC

## 2023-08-17 NOTE — Progress Notes (Signed)
 Office Visit  BP 99/63 (BP Location: Left Arm, Patient Position: Sitting, Cuff Size: Normal)   Pulse 92   Temp 97.6 F (36.4 C) (Oral)   Resp 18   Wt 176 lb 12.8 oz (80.2 kg)   SpO2 95%   BMI 24.83 kg/m    Subjective:    Patient ID: Karl Media., male    DOB: 01-Jan-1961, 63 y.o.   MRN: [id number]  HPI: Karl Broughton. is a 63 y.o. male  Chief Complaint  Patient presents with   Rash    Getting better, no new concerns.     Discussed the use of AI scribe software for clinical note transcription with the patient, who gave verbal consent to proceed.  History of Present Illness   Karl Clenney. is a 63 year old male with diabetes and chronic back pain who presents for follow-up on his rash and diabetes management.  He has been experiencing a rash, which is currently improving with the use of a cream. There are no new symptoms or complications related to the rash.  He is managing his diabetes with Ozempic at a dose of 0.25 mg, currently in his third week on this dose. He has not experienced symptoms of hypoglycemia, such as dizziness, lightheadedness, or hunger. He is also on insulin at a dose of 30 units but has faced challenges with insurance in obtaining a glucose monitor.  He experiences chronic back pain and has a history of a surgical procedure involving a cage at L3, L4, and L5. He has previously undergone injections, which he found helpful, but is not currently taking any medication for the back pain.  He reports stiffness in his hands, making it difficult to hold objects, which he attributes to arthritis. He is not currently taking any medication like ibuprofen for it.  He mentions a new relationship with a girlfriend, which he describes as a positive development in his life.      Relevant past medical, surgical, family and social history reviewed and updated as indicated. Interim medical history since our last visit reviewed. Allergies  and medications reviewed and updated.  ROS per HPI unless specifically indicated above     Objective:    BP 99/63 (BP Location: Left Arm, Patient Position: Sitting, Cuff Size: Normal)   Pulse 92   Temp 97.6 F (36.4 C) (Oral)   Resp 18   Wt 176 lb 12.8 oz (80.2 kg)   SpO2 95%   BMI 24.83 kg/m   Wt Readings from Last 3 Encounters:  08/17/23 176 lb 12.8 oz (80.2 kg)  07/29/23 181 lb 3.2 oz (82.2 kg)  07/20/23 180 lb 6.4 oz (81.8 kg)     Physical Exam Constitutional:      Appearance: Normal appearance.  Pulmonary:     Effort: Pulmonary effort is normal.  Musculoskeletal:        General: Normal range of motion.  Skin:    Comments: Normal skin color  Neurological:     General: No focal deficit present.     Mental Status: He is alert. Mental status is at baseline.  Psychiatric:        Mood and Affect: Mood normal.        Behavior: Behavior normal.        Thought Content: Thought content normal.         08/17/2023    9:30 AM 07/29/2023   11:04 AM 07/20/2023    9:05 AM 07/07/2023  2:22 PM 06/10/2023   11:44 AM  Depression screen PHQ 2/9  Decreased Interest 3 3 3 1 3   Down, Depressed, Hopeless 3 3 2 1 3   PHQ - 2 Score 6 6 5 2 6   Altered sleeping 3 3 3 2 3   Tired, decreased energy 3 2 2 3 3   Change in appetite 3 3 2 1 2   Feeling bad or failure about yourself  3 3 3 1 3   Trouble concentrating 3 3 2 1 1   Moving slowly or fidgety/restless 3 2 3 1 3   Suicidal thoughts 3 3 2 1 3   PHQ-9 Score 27 25 22 12 24   Difficult doing work/chores Extremely dIfficult Very difficult Very difficult Very difficult Very difficult       08/17/2023    9:30 AM 07/20/2023    9:06 AM 07/07/2023    2:24 PM 06/10/2023   11:44 AM  GAD 7 : Generalized Anxiety Score  Nervous, Anxious, on Edge 3 3 1 1   Control/stop worrying 3 2 2 2   Worry too much - different things 3 3 2    Trouble relaxing 3 3 1    Restless 3 2 1    Easily annoyed or irritable 3 2 2    Afraid - awful might happen 3 3 1    Total  GAD 7 Score 21 18 10    Anxiety Difficulty Extremely difficult Very difficult Very difficult        Assessment & Plan:  Assessment & Plan   Type 2 diabetes mellitus with hyperglycemia, with long-term current use of insulin (HCC) Assessment & Plan: On Ozempic and insulin. Currently in the third week of Ozempic at 0.25mg  dose. -Increase Ozempic to 0.5mg  dose. -Continue current insulin regimen at 30 units. -Check blood glucose levels regularly and monitor for hypoglycemia.  Orders: -     Ozempic (0.25 or 0.5 MG/DOSE); Inject 0.5 mg into the skin once a week. Inject 0.25 mg into the skin weekly for 4 weeks, then increase to 0.5 mg weekly  Dispense: 3 mL; Refill: 0  Hidradenitis suppurativa Assessment & Plan: Improvement noted with current treatment. Discussed the need for prolonged antibiotic therapy. -Continue current antibiotic for a total of three months. -Continue current topical cream.   Acute left-sided low back pain with left-sided sciatica Assessment & Plan: History of back surgery with cage placement at L3, L4, and L5. Reports ongoing pain. Declined referral to orthopedics and Celebrex. -No changes to current management at this time.  Orders: -     methylPREDNISolone; Follow instruction on packaging.  Dispense: 21 each; Refill: 0  Arthritis Assessment & Plan: Reports increasing stiffness in hands, difficulty holding onto things. Declined Celebrex. -Start Medrol pack to help with joint pain.   Encounter for behavioral health screening As part of their intake evaluation, the patient was screened for depression, anxiety.  PHQ9 SCORE 27, GAD7 SCORE 25. Screening results positive for tested conditions. Unable to afford meidcations, improving, consider tx in the future.     Follow up plan: Return in about 2 months (around 10/17/2023) for DM, Chronic illness f/u.  Karl Confer, MD

## 2023-08-17 NOTE — Patient Instructions (Signed)
 Extend doxycycline to 3 months   Medrol for back pain

## 2023-08-23 ENCOUNTER — Other Ambulatory Visit: Payer: Self-pay

## 2023-08-23 ENCOUNTER — Other Ambulatory Visit: Payer: Self-pay | Admitting: *Deleted

## 2023-08-23 NOTE — Patient Instructions (Signed)
  Medicaid Managed Care   Unsuccessful Outreach Note  08/23/2023 Name: Karl Weaver. MRN: [id number] DOB: 11/05/1960  Referred by: Jackolyn Confer, MD Reason for referral : High Risk Managed Medicaid (MM social work unsuccessful telephone outreach )   An unsuccessful telephone outreach was attempted today. The patient was referred to the case management team for assistance with care management and care coordination.   Follow Up Plan: A HIPAA compliant phone message was left for the patient providing contact information and requesting a return call.   Abelino Derrick, MHA [facility]  Managed [facility] Social Worker [phone]

## 2023-08-23 NOTE — Patient Outreach (Signed)
  Medicaid Managed Care   Unsuccessful Outreach Note  08/23/2023 Name: Karl Weaver. MRN: [id number] DOB: 11/05/1960  Referred by: Jackolyn Confer, MD Reason for referral : High Risk Managed Medicaid (MM social work unsuccessful telephone outreach )   An unsuccessful telephone outreach was attempted today. The patient was referred to the case management team for assistance with care management and care coordination.   Follow Up Plan: A HIPAA compliant phone message was left for the patient providing contact information and requesting a return call.   Abelino Derrick, MHA [facility]  Managed [facility] Social Worker [phone]

## 2023-08-23 NOTE — Patient Instructions (Signed)
 Visit Information  [name].  - as a part of your Medicaid benefit, you are eligible for care management and care coordination services at no cost or copay. I was unable to reach you by phone today but would be happy to help you with your health related needs. Please feel free to call me @ [phone].   A member of the Managed Medicaid care management team will reach out to you again over the next 7 days.   Estanislado Emms RN, BSN [place]  [facility] RN Care Manager [phone]

## 2023-08-23 NOTE — Patient Outreach (Signed)
  Medicaid Managed Care   Unsuccessful Attempt Note   08/23/2023 Name: Karl Weaver. MRN: [id number] DOB: 08-09-1960  Referred by: Jackolyn Confer, MD Reason for referral : High Risk Managed Medicaid (Unsuccessful RNCM follow up telephone outreach)   An unsuccessful telephone outreach was attempted today. The patient was referred to the case management team for assistance with care management and care coordination.    Follow Up Plan: A HIPAA compliant phone message was left for the patient providing contact information and requesting a return call. and The Managed Medicaid care management team will reach out to the patient again over the next 7 days.    Estanislado Emms RN, BSN   [facility] RN Care Manager [phone] [facility]

## 2023-08-25 ENCOUNTER — Encounter: Payer: Self-pay | Admitting: Pediatrics

## 2023-08-25 ENCOUNTER — Other Ambulatory Visit: Payer: Self-pay

## 2023-08-25 DIAGNOSIS — M1990 Unspecified osteoarthritis, unspecified site: Secondary | ICD-10-CM | POA: Insufficient documentation

## 2023-08-25 NOTE — Assessment & Plan Note (Signed)
 Improvement noted with current treatment. Discussed the need for prolonged antibiotic therapy. -Continue current antibiotic for a total of three months. -Continue current topical cream.

## 2023-08-25 NOTE — Assessment & Plan Note (Signed)
 Reports increasing stiffness in hands, difficulty holding onto things. Declined Celebrex. -Start Medrol pack to help with joint pain.

## 2023-08-25 NOTE — Assessment & Plan Note (Signed)
 History of back surgery with cage placement at L3, L4, and L5. Reports ongoing pain. Declined referral to orthopedics and Celebrex. -No changes to current management at this time.

## 2023-08-25 NOTE — Progress Notes (Signed)
   08/25/2023  Patient ID: [name]., male   DOB: May 04, 1961, 63 y.o.   MRN: [id number]  Subjective/objective Telephone visit to follow-up on management of T2DM  Diabetes Management Plan -Current medications:  Ozempic 0.25mg  weekly  -Patient will take 4th dose of Ozempic 0.25mg  this week -Prescribed Lantus but patient is not using (averse to daily injections) -Not currently monitoring home BG-patient states insurance did not cover Libre 3.  I had contacted pharmacy last month and verified sensors and reader were going through at $0 copay. -A1c last month 10.1%  Assessment/plan  Diabetes Management Plan -Increase to Ozempick 0.5mg  weekly starting with next week's dose -Contacted [facility], and they are able to fill sensors and reader at $0 for patient.  I have made Mr. Caggiano aware- he plans to pick up and have pharmacist on duty assist with setup/use.  Told him to reach out if he needs further assistance with this. -Patient sees PCP again in May and will be due for A1c -Goal to titrate Ozempic to 2mg  weekly as tolerated; and if needed, can add SGLT2 if A1c >7%  Follow-up:  4 weeks  Lenna Gilford, PharmD, DPLA

## 2023-08-25 NOTE — Assessment & Plan Note (Signed)
 On Ozempic and insulin. Currently in the third week of Ozempic at 0.25mg  dose. -Increase Ozempic to 0.5mg  dose. -Continue current insulin regimen at 30 units. -Check blood glucose levels regularly and monitor for hypoglycemia.

## 2023-08-31 ENCOUNTER — Telehealth: Payer: Self-pay

## 2023-08-31 NOTE — Progress Notes (Signed)
..   Medicaid Managed Care   Unsuccessful Outreach Note  08/31/2023 Name: Karl Weaver. MRN: [id number] DOB: Dec 16, 1960  Referred by: Jackolyn Confer, MD Reason for referral : High Risk Managed Medicaid   Third unsuccessful telephone outreach was attempted today. The patient was referred to the case management team for assistance with care management and care coordination. The patient's primary care provider has been notified of our unsuccessful attempts to make or maintain contact with the patient. The care management team is pleased to engage with this patient at any time in the future should he/she be interested in assistance from the care management team.   Follow Up Plan: We have been unable to make contact with the patient for follow up. The care management team is available to follow up with the patient after provider conversation with the patient regarding recommendation for care management engagement and subsequent re-referral to the care management team.   Weston Settle [facility] Guide Direct Dial: [phone]  Fax: [phone]

## 2023-09-05 ENCOUNTER — Ambulatory Visit: Payer: Self-pay | Admitting: Psychiatry

## 2023-09-18 ENCOUNTER — Other Ambulatory Visit: Payer: Self-pay | Admitting: Pediatrics

## 2023-09-18 DIAGNOSIS — E1165 Type 2 diabetes mellitus with hyperglycemia: Secondary | ICD-10-CM

## 2023-09-20 NOTE — Telephone Encounter (Signed)
 Requested medication (s) are due for refill today: yes  Requested medication (s) are on the active medication list: no  Last refill:  08/17/23  Future visit scheduled: yes  Notes to clinic:  expired 09/16/23   Requested Prescriptions  Pending Prescriptions Disp Refills   OZEMPIC, 0.25 OR 0.5 MG/DOSE, 2 MG/3ML SOPN [Pharmacy Med Name: OZEMPIC 0.25-0.5 MG/DOSE PEN]      Sig: INJECT 0.25 MG INTO THE SKIN WEEKLY FOR 4 WEEKS, THEN INCREASE TO 0.5 MG WEEKLY     Endocrinology:  Diabetes - GLP-1 Receptor Agonists - semaglutide Failed - 09/20/2023  8:07 AM      Failed - HBA1C in normal range and within 180 days    HB A1C (BAYER DCA - WAIVED)  Date Value Ref Range Status  07/20/2023 10.1 (H) 4.8 - 5.6 % Final    Comment:             Prediabetes: 5.7 - 6.4          Diabetes: >6.4          Glycemic control for adults with diabetes: <7.0          Passed - Cr in normal range and within 360 days    Creatinine  Date Value Ref Range Status  09/01/2012 1.11 0.60 - 1.30 mg/dL Final   Creatinine, Ser  Date Value Ref Range Status  06/10/2023 0.99 0.40 - 1.50 mg/dL Final   Creatinine,U  Date Value Ref Range Status  06/10/2023 126.6 mg/dL Final         Passed - Valid encounter within last 6 months    Recent Outpatient Visits           1 month ago Type 2 diabetes mellitus with hyperglycemia, with long-term current use of insulin (HCC)   [place] [facility] Jackolyn Confer, MD   1 month ago Type 2 diabetes mellitus with hyperglycemia, with long-term current use of insulin [facility])   [place] [facility] Jackolyn Confer, MD   2 months ago Type 2 diabetes mellitus with hyperglycemia, with long-term current use of insulin [facility])   [place] [facility] Jackolyn Confer, MD       Future Appointments             In 4 weeks Evelene Croon Atilano Median, MD [facility]

## 2023-09-22 ENCOUNTER — Other Ambulatory Visit: Payer: Self-pay

## 2023-09-22 NOTE — Progress Notes (Unsigned)
   09/22/2023  Patient ID: Karl Media., male   DOB: 07-09-1960, 63 y.o.   MRN: [id number]  Outreach attempt for scheduled telephone visit unsuccessful, but I was able to leave HIPAA compliant voicemail with my direct phone number.  Will attempt to call again in 1-2 weeks if I do not hear back.  Lenna Gilford, PharmD, DPLA

## 2023-10-14 ENCOUNTER — Other Ambulatory Visit: Payer: Self-pay

## 2023-10-14 NOTE — Progress Notes (Signed)
   10/14/2023  Patient ID: Karl Lek., male   DOB: 1960-11-18, 62 y.o.   MRN: [id number]  Subjective/objective Telephone visit to follow-up on management of T2DM   Diabetes Management Plan -Current medications:  Ozempic  0.5mg  weekly -Patient recently increased to 0.5mg  dose of Ozempic  and endorses tolerating well -Prescribed Lantus  but patient is not using (averse to daily injections) -States he was able to pick up Libre 3 CGM and get set up with this for home monitoring of BG- does not provide any readings -A1c last month 10.1% -Patient states he has moved to [facility] and will be need to cancel upcoming appointment with Dr. Juliette Oh- plans to establish care with new PCP there   Assessment/plan   Diabetes Management Plan -Continue current regimen and establish care with new provider in [facility] ASAP for continuity of care -Canceled patient's upcoming visit with Dr. Juliette Oh and sending a message to make her aware   Linn Rich, PharmD, DPLA

## 2023-10-18 ENCOUNTER — Telehealth: Payer: Self-pay

## 2023-10-18 NOTE — Transitions of Care (Post Inpatient/ED Visit) (Signed)
 10/18/2023  Name: Karl Weaver. MRN: [id number] DOB: 07/07/1960  Today's TOC FU Call Status: Today's TOC FU Call Status:: Successful TOC FU Call Completed TOC FU Call Complete Date: 10/18/23 Patient's Name and Date of Birth confirmed.  Transition Care Management Follow-up Telephone Call Date of Discharge: 10/17/23 Discharge Facility: Other ([facility]) Name of Other ([facility]) Discharge Facility: [facility] Type of Discharge: Inpatient Admission Primary Inpatient Discharge Diagnosis:: cholecystitis How have you been since you were released from the hospital?: Better Any questions or concerns?: No  Items Reviewed: Did you receive and understand the discharge instructions provided?: Yes Medications obtained,verified, and reconciled?: Yes (Medications Reviewed) Any new allergies since your discharge?: No Dietary orders reviewed?: Yes Do you have support at home?: Yes People in Home [RPT]: child(ren), adult  Medications Reviewed Today: Medications Reviewed Today     Reviewed by Darrall Ellison, LPN (Licensed Practical Nurse) on 10/18/23 at 0940  Med List Status: <None>   Medication Order Taking? Sig Documenting Provider Last Dose Status Informant  atorvastatin  (LIPITOR) 10 MG tablet [id number] No Take 1 tablet (10 mg total) by mouth daily.  Patient not taking: Reported on 08/17/2023   Hadassah Letters, MD Not Taking Active   benzoyl peroxide  5 % gel [id number] No Apply topically 2 (two) times daily.  Patient not taking: Reported on 07/21/2023   Hadassah Letters, MD Not Taking Active   clindamycin  (CLINDAGEL) 1 % gel [id number] No Apply topically 2 (two) times daily. Apply to areas that are irritated and red as needed. Hadassah Letters, MD Taking Active   Continuous Glucose Receiver (FREESTYLE LIBRE 3 READER) DEVI [id number] No Use for continuous glucose monitoring Hadassah Letters, MD Taking Active   Continuous Glucose Sensor (FREESTYLE LIBRE 3 PLUS SENSOR) MISC [id number] No  Change sensor every 15 days. Hadassah Letters, MD Taking Active   famotidine  (PEPCID ) 20 MG tablet [id number] No Take 1 tablet (20 mg total) by mouth 2 (two) times daily. Dorothe Gaster, NP Taking Active   insulin  glargine (LANTUS  SOLOSTAR) 100 UNIT/ML Solostar Pen [id number] No Inject 30 Units into the skin daily.  Patient not taking: Reported on 08/25/2023   Hadassah Letters, MD Not Taking Active   Insulin  Pen Needle (PEN NEEDLES) 32G X 4 MM MISC [id number] No Used to inject insulin  once daily  Patient not taking: Reported on 08/25/2023   Hadassah Letters, MD Not Taking Active   methylPREDNISolone  (MEDROL  DOSEPAK) 4 MG TBPK tablet [id number] No Follow instruction on packaging. Hadassah Letters, MD Taking Active   pregabalin  (LYRICA ) 25 MG capsule [id number] No Take 1 capsule (25 mg total) by mouth 2 (two) times daily. Hadassah Letters, MD Taking Active   Semaglutide ,0.25 or 0.5MG /DOS, (OZEMPIC , 0.25 OR 0.5 MG/DOSE,) 2 MG/3ML SOPN [id number] No Inject 0.5 mg into the skin once a week. Hadassah Letters, MD Taking Active             Home Care and Equipment/Supplies: Were Home Health Services Ordered?: NA Any new equipment or medical supplies ordered?: NA  Functional Questionnaire: Do you need assistance with bathing/showering or dressing?: No Do you need assistance with meal preparation?: No Do you need assistance with eating?: No Do you have difficulty maintaining continence: No Do you need assistance with getting out of bed/getting out of a chair/moving?: No Do you have difficulty managing or taking your medications?: No  Follow up appointments reviewed: PCP Follow-up appointment confirmed?: No (moved to [facility]) MD Provider Line Number:[phone] Given:  No Specialist Hospital Follow-up appointment confirmed?: No Reason Specialist Follow-Up Not Confirmed: Patient has Specialist Provider Number and will Call for Appointment Do you need transportation to your follow-up appointment?: No Do you  understand care options if your condition(s) worsen?: Yes-patient verbalized understanding    SIGNATURE Darrall Ellison, LPN [facility] Nurse Health Advisor Direct Dial [phone]

## 2023-10-19 ENCOUNTER — Ambulatory Visit: Admitting: Pediatrics

## 2024-01-13 ENCOUNTER — Other Ambulatory Visit: Payer: Self-pay | Admitting: Nurse Practitioner

## 2024-01-13 DIAGNOSIS — K219 Gastro-esophageal reflux disease without esophagitis: Secondary | ICD-10-CM

## 2024-01-18 ENCOUNTER — Telehealth: Payer: Self-pay

## 2024-01-18 ENCOUNTER — Other Ambulatory Visit (HOSPITAL_COMMUNITY): Payer: Self-pay

## 2024-01-18 NOTE — Telephone Encounter (Signed)
 Pharmacy Patient Advocate Encounter  Received notification from [facility] that Prior Authorization for FreeStyle Libre 3 Plus Sensor  has been APPROVED from - to -. Ran test claim, Copay is $0. This test claim was processed through [facility]- copay amounts may vary at other pharmacies due to pharmacy/plan contracts, or as the patient moves through the different stages of their insurance plan.   PA #/Case ID/Reference #: [id number]  *[facility] to [facility]

## 2024-07-16 ENCOUNTER — Other Ambulatory Visit (HOSPITAL_COMMUNITY): Payer: Self-pay

## 2024-07-18 ENCOUNTER — Other Ambulatory Visit (HOSPITAL_COMMUNITY): Payer: Self-pay

## 2024-11-30 ENCOUNTER — Encounter: Admitting: Nurse Practitioner
